# Patient Record
Sex: Female | Born: 1980 | Race: White | Hispanic: No | Marital: Single | Smoking: Current every day smoker
Health system: Southern US, Community
[De-identification: ages and names within clinical notes are randomized; demographics above are authoritative.]

## PROBLEM LIST (undated history)

## (undated) ENCOUNTER — Inpatient Hospital Stay (HOSPITAL_COMMUNITY): Payer: Self-pay

## (undated) DIAGNOSIS — G43909 Migraine, unspecified, not intractable, without status migrainosus: Secondary | ICD-10-CM

## (undated) DIAGNOSIS — M51369 Other intervertebral disc degeneration, lumbar region without mention of lumbar back pain or lower extremity pain: Secondary | ICD-10-CM

## (undated) DIAGNOSIS — F329 Major depressive disorder, single episode, unspecified: Secondary | ICD-10-CM

## (undated) DIAGNOSIS — F419 Anxiety disorder, unspecified: Secondary | ICD-10-CM

## (undated) DIAGNOSIS — M5136 Other intervertebral disc degeneration, lumbar region: Secondary | ICD-10-CM

## (undated) DIAGNOSIS — F32A Depression, unspecified: Secondary | ICD-10-CM

## (undated) HISTORY — PX: NO PAST SURGERIES: SHX2092

## (undated) HISTORY — DX: Other intervertebral disc degeneration, lumbar region: M51.36

## (undated) HISTORY — PX: INDUCED ABORTION: SHX677

---
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
## 1999-04-23 ENCOUNTER — Emergency Department (HOSPITAL_COMMUNITY): Admission: EM | Admit: 1999-04-23 | Discharge: 1999-04-23 | Payer: Self-pay | Admitting: Emergency Medicine

## 1999-09-12 ENCOUNTER — Other Ambulatory Visit: Admission: RE | Admit: 1999-09-12 | Discharge: 1999-09-12 | Payer: Self-pay | Admitting: Obstetrics

## 2000-02-21 ENCOUNTER — Inpatient Hospital Stay (HOSPITAL_COMMUNITY): Admission: AD | Admit: 2000-02-21 | Discharge: 2000-02-21 | Payer: Self-pay | Admitting: Obstetrics

## 2000-03-21 ENCOUNTER — Inpatient Hospital Stay: Admission: AD | Admit: 2000-03-21 | Discharge: 2000-03-21 | Payer: Self-pay | Admitting: Obstetrics

## 2000-04-01 ENCOUNTER — Inpatient Hospital Stay (HOSPITAL_COMMUNITY): Admission: AD | Admit: 2000-04-01 | Discharge: 2000-04-01 | Payer: Self-pay | Admitting: Obstetrics

## 2000-04-23 ENCOUNTER — Inpatient Hospital Stay (HOSPITAL_COMMUNITY): Admission: AD | Admit: 2000-04-23 | Discharge: 2000-04-23 | Payer: Self-pay | Admitting: Obstetrics

## 2000-04-30 ENCOUNTER — Inpatient Hospital Stay (HOSPITAL_COMMUNITY): Admission: AD | Admit: 2000-04-30 | Discharge: 2000-05-02 | Payer: Self-pay | Admitting: Obstetrics

## 2001-08-05 ENCOUNTER — Inpatient Hospital Stay (HOSPITAL_COMMUNITY): Admission: AD | Admit: 2001-08-05 | Discharge: 2001-08-05 | Payer: Self-pay | Admitting: Obstetrics

## 2001-10-11 ENCOUNTER — Inpatient Hospital Stay (HOSPITAL_COMMUNITY): Admission: AD | Admit: 2001-10-11 | Discharge: 2001-10-11 | Payer: Self-pay | Admitting: Obstetrics

## 2001-10-27 ENCOUNTER — Inpatient Hospital Stay (HOSPITAL_COMMUNITY): Admission: AD | Admit: 2001-10-27 | Discharge: 2001-10-27 | Payer: Self-pay | Admitting: Obstetrics

## 2001-10-31 ENCOUNTER — Inpatient Hospital Stay (HOSPITAL_COMMUNITY): Admission: AD | Admit: 2001-10-31 | Discharge: 2001-10-31 | Payer: Self-pay | Admitting: Obstetrics

## 2001-11-04 ENCOUNTER — Inpatient Hospital Stay (HOSPITAL_COMMUNITY): Admission: AD | Admit: 2001-11-04 | Discharge: 2001-11-06 | Payer: Self-pay | Admitting: Obstetrics

## 2002-11-25 ENCOUNTER — Inpatient Hospital Stay (HOSPITAL_COMMUNITY): Admission: AD | Admit: 2002-11-25 | Discharge: 2002-11-25 | Payer: Self-pay | Admitting: Obstetrics

## 2003-05-12 ENCOUNTER — Inpatient Hospital Stay (HOSPITAL_COMMUNITY): Admission: AD | Admit: 2003-05-12 | Discharge: 2003-05-12 | Payer: Self-pay | Admitting: Obstetrics

## 2003-05-25 ENCOUNTER — Inpatient Hospital Stay (HOSPITAL_COMMUNITY): Admission: AD | Admit: 2003-05-25 | Discharge: 2003-05-25 | Payer: Self-pay | Admitting: Obstetrics

## 2003-07-07 ENCOUNTER — Inpatient Hospital Stay (HOSPITAL_COMMUNITY): Admission: AD | Admit: 2003-07-07 | Discharge: 2003-07-07 | Payer: Self-pay | Admitting: Obstetrics

## 2003-07-13 ENCOUNTER — Inpatient Hospital Stay (HOSPITAL_COMMUNITY): Admission: AD | Admit: 2003-07-13 | Discharge: 2003-07-13 | Payer: Self-pay | Admitting: Obstetrics

## 2003-07-23 ENCOUNTER — Inpatient Hospital Stay (HOSPITAL_COMMUNITY): Admission: AD | Admit: 2003-07-23 | Discharge: 2003-07-23 | Payer: Self-pay | Admitting: Obstetrics

## 2003-08-02 ENCOUNTER — Inpatient Hospital Stay (HOSPITAL_COMMUNITY): Admission: AD | Admit: 2003-08-02 | Discharge: 2003-08-02 | Payer: Self-pay | Admitting: Obstetrics

## 2003-08-03 ENCOUNTER — Inpatient Hospital Stay (HOSPITAL_COMMUNITY): Admission: AD | Admit: 2003-08-03 | Discharge: 2003-08-05 | Payer: Self-pay | Admitting: Obstetrics

## 2004-01-31 ENCOUNTER — Emergency Department (HOSPITAL_COMMUNITY): Admission: EM | Admit: 2004-01-31 | Discharge: 2004-01-31 | Payer: Self-pay | Admitting: Emergency Medicine

## 2004-10-11 ENCOUNTER — Emergency Department (HOSPITAL_COMMUNITY): Admission: EM | Admit: 2004-10-11 | Discharge: 2004-10-11 | Payer: Self-pay | Admitting: Emergency Medicine

## 2005-08-02 ENCOUNTER — Emergency Department (HOSPITAL_COMMUNITY): Admission: EM | Admit: 2005-08-02 | Discharge: 2005-08-02 | Payer: Self-pay | Admitting: Emergency Medicine

## 2005-10-24 ENCOUNTER — Encounter: Payer: Self-pay | Admitting: Emergency Medicine

## 2005-10-25 ENCOUNTER — Emergency Department (HOSPITAL_COMMUNITY): Admission: EM | Admit: 2005-10-25 | Discharge: 2005-10-25 | Payer: Self-pay | Admitting: Family Medicine

## 2006-01-30 ENCOUNTER — Emergency Department (HOSPITAL_COMMUNITY): Admission: EM | Admit: 2006-01-30 | Discharge: 2006-01-30 | Payer: Self-pay | Admitting: Family Medicine

## 2006-03-26 ENCOUNTER — Emergency Department (HOSPITAL_COMMUNITY): Admission: EM | Admit: 2006-03-26 | Discharge: 2006-03-26 | Payer: Self-pay | Admitting: Family Medicine

## 2006-10-29 ENCOUNTER — Emergency Department (HOSPITAL_COMMUNITY): Admission: EM | Admit: 2006-10-29 | Discharge: 2006-10-29 | Payer: Self-pay | Admitting: Family Medicine

## 2007-04-23 ENCOUNTER — Inpatient Hospital Stay (HOSPITAL_COMMUNITY): Admission: AD | Admit: 2007-04-23 | Discharge: 2007-04-23 | Payer: Self-pay | Admitting: Obstetrics & Gynecology

## 2007-04-25 ENCOUNTER — Inpatient Hospital Stay (HOSPITAL_COMMUNITY): Admission: AD | Admit: 2007-04-25 | Discharge: 2007-04-25 | Payer: Self-pay | Admitting: Obstetrics

## 2007-05-05 ENCOUNTER — Inpatient Hospital Stay (HOSPITAL_COMMUNITY): Admission: AD | Admit: 2007-05-05 | Discharge: 2007-05-05 | Payer: Self-pay | Admitting: Obstetrics

## 2007-07-02 ENCOUNTER — Ambulatory Visit (HOSPITAL_COMMUNITY): Admission: RE | Admit: 2007-07-02 | Discharge: 2007-07-02 | Payer: Self-pay | Admitting: Obstetrics

## 2007-08-08 ENCOUNTER — Emergency Department (HOSPITAL_COMMUNITY): Admission: EM | Admit: 2007-08-08 | Discharge: 2007-08-08 | Payer: Self-pay | Admitting: Emergency Medicine

## 2007-08-09 ENCOUNTER — Inpatient Hospital Stay (HOSPITAL_COMMUNITY): Admission: AD | Admit: 2007-08-09 | Discharge: 2007-08-09 | Payer: Self-pay | Admitting: Obstetrics

## 2007-10-31 ENCOUNTER — Inpatient Hospital Stay (HOSPITAL_COMMUNITY): Admission: AD | Admit: 2007-10-31 | Discharge: 2007-10-31 | Payer: Self-pay | Admitting: Obstetrics

## 2007-11-24 ENCOUNTER — Inpatient Hospital Stay (HOSPITAL_COMMUNITY): Admission: AD | Admit: 2007-11-24 | Discharge: 2007-11-24 | Payer: Self-pay | Admitting: Obstetrics

## 2007-12-28 ENCOUNTER — Inpatient Hospital Stay (HOSPITAL_COMMUNITY): Admission: AD | Admit: 2007-12-28 | Discharge: 2007-12-28 | Payer: Self-pay | Admitting: Obstetrics

## 2008-01-14 ENCOUNTER — Inpatient Hospital Stay (HOSPITAL_COMMUNITY): Admission: AD | Admit: 2008-01-14 | Discharge: 2008-01-14 | Payer: Self-pay | Admitting: Obstetrics

## 2008-01-22 ENCOUNTER — Inpatient Hospital Stay (HOSPITAL_COMMUNITY): Admission: AD | Admit: 2008-01-22 | Discharge: 2008-01-22 | Payer: Self-pay | Admitting: Obstetrics

## 2008-01-26 ENCOUNTER — Inpatient Hospital Stay (HOSPITAL_COMMUNITY): Admission: AD | Admit: 2008-01-26 | Discharge: 2008-01-28 | Payer: Self-pay | Admitting: Obstetrics

## 2008-03-09 ENCOUNTER — Emergency Department (HOSPITAL_COMMUNITY): Admission: EM | Admit: 2008-03-09 | Discharge: 2008-03-09 | Payer: Self-pay | Admitting: Family Medicine

## 2008-08-25 ENCOUNTER — Inpatient Hospital Stay (HOSPITAL_COMMUNITY): Admission: AD | Admit: 2008-08-25 | Discharge: 2008-08-25 | Payer: Self-pay | Admitting: Obstetrics

## 2008-09-18 IMAGING — US US OB COMP LESS 14 WK
1 series · 14 of 27 positions shown · non-contrast
Comparison: none

OBSTETRICAL ULTRASOUND:

 This ultrasound exam was performed in the [HOSPITAL] Ultrasound Department.  The OB US report was generated in the AS system, and faxed to the ordering physician.  This report is also available in [REDACTED] PACS.

[Series 1: us ob comp less 14 wk · 0.26mm/px · 14 of 27 slices shown]
[im 1/27]
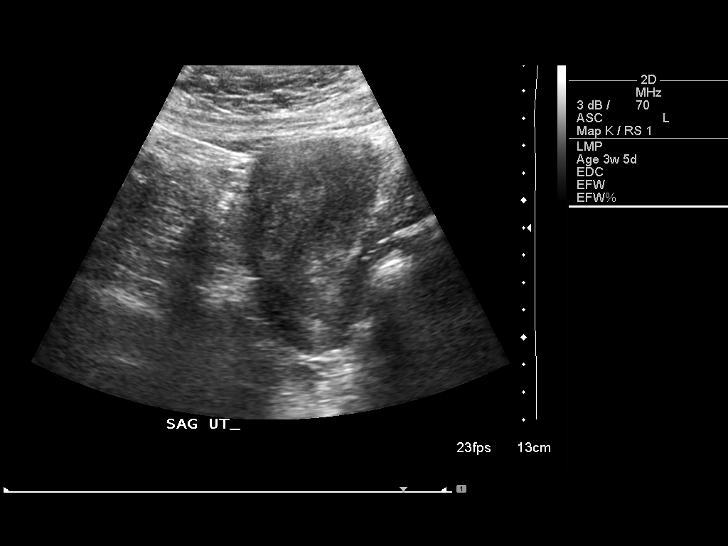
[im 3/27]
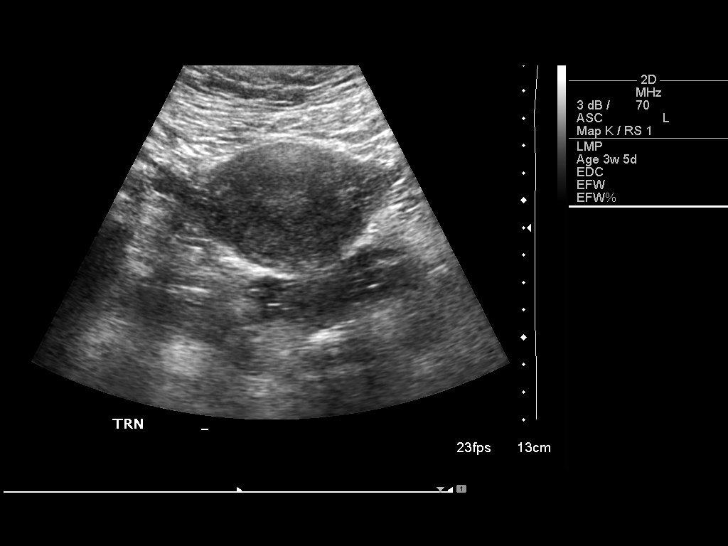
[im 5/27]
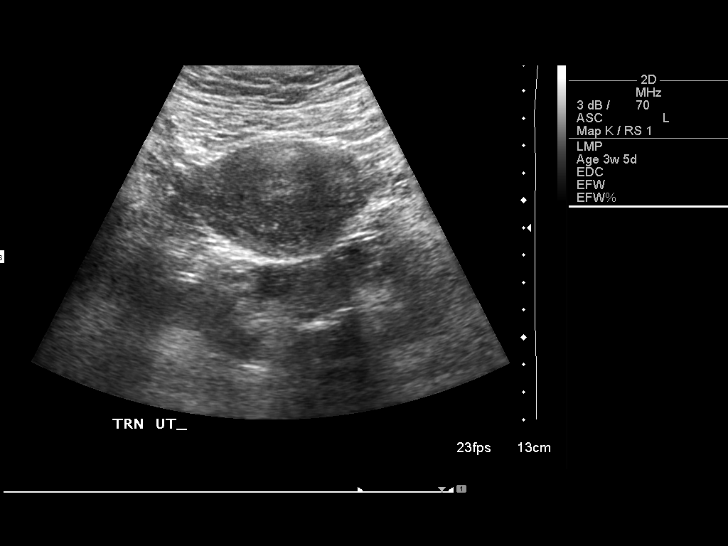
[im 7/27]
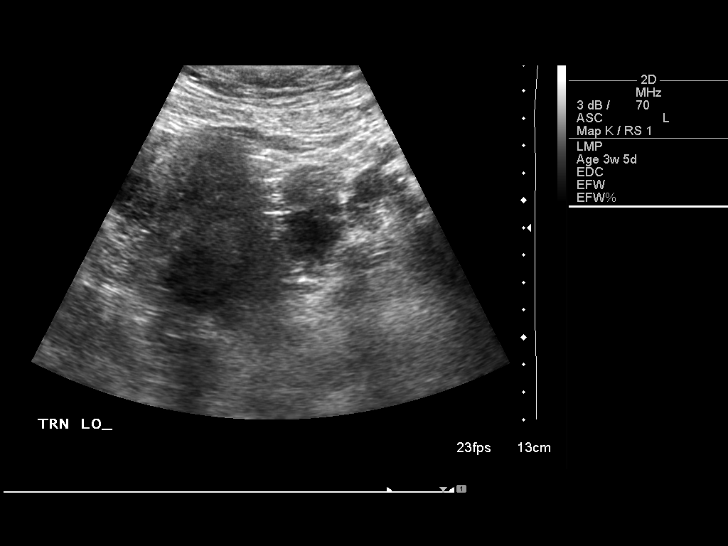
[im 9/27]
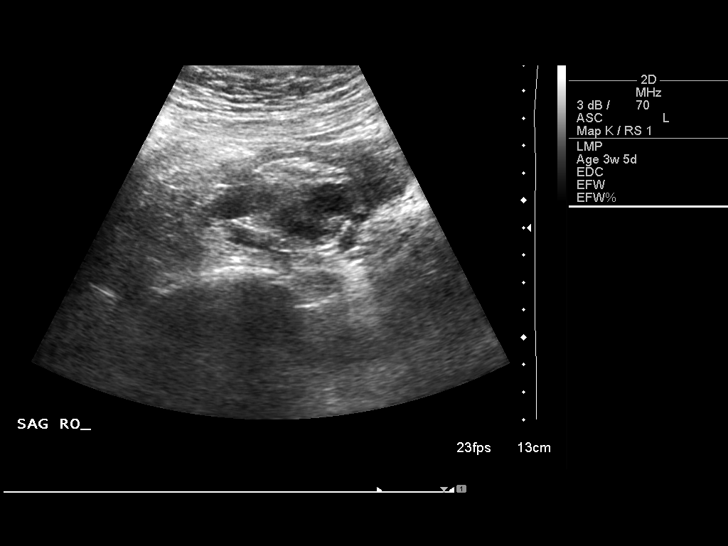
[im 11/27]
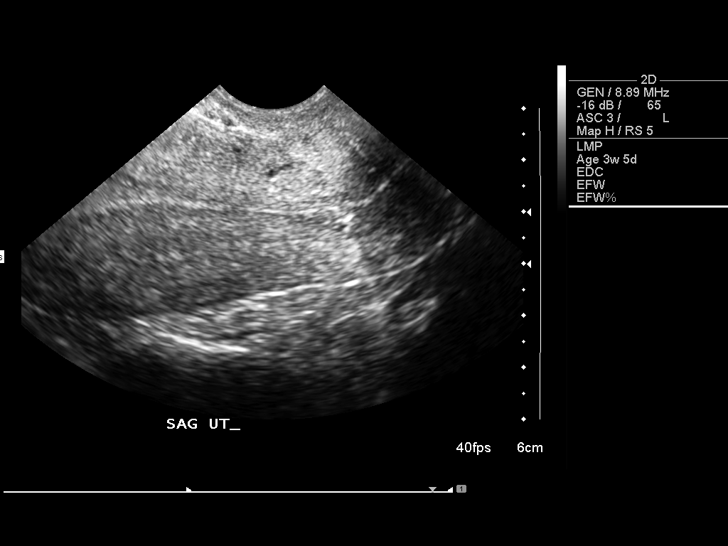
[im 13/27]
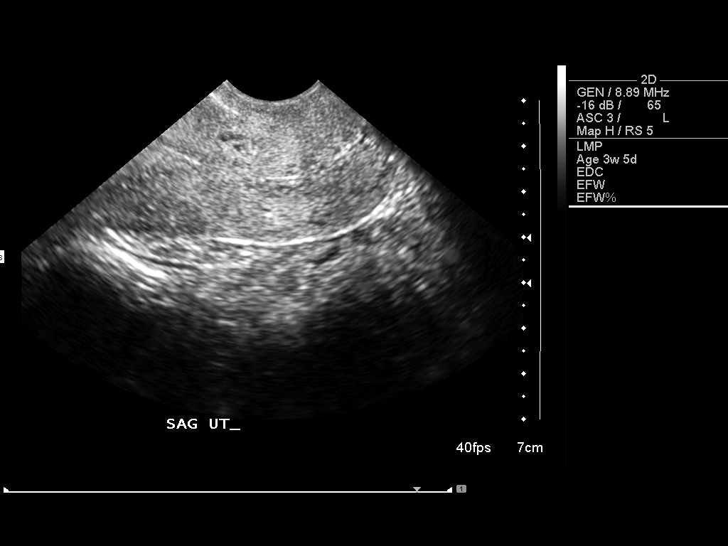
[im 15/27]
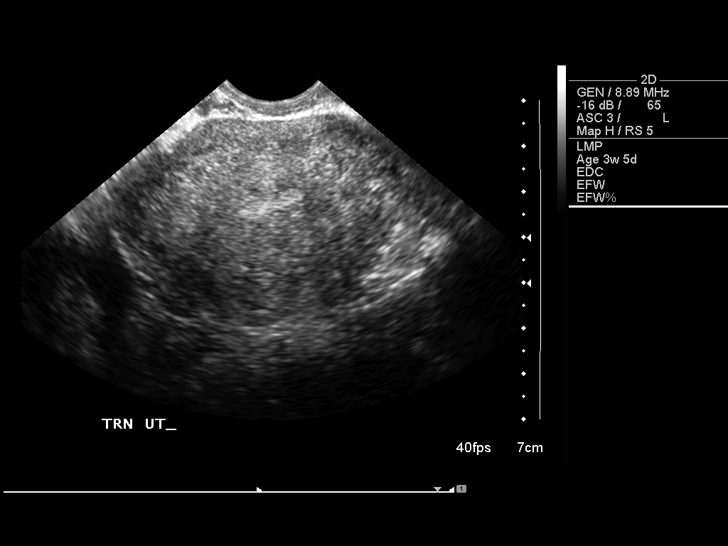
[im 17/27]
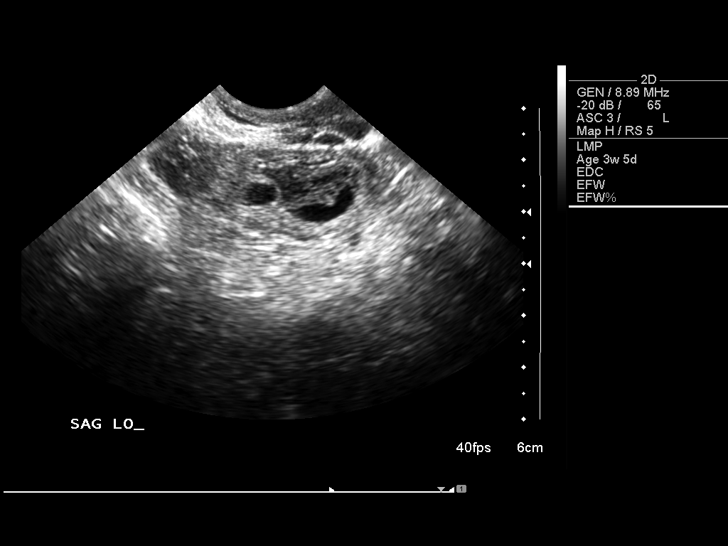
[im 19/27]
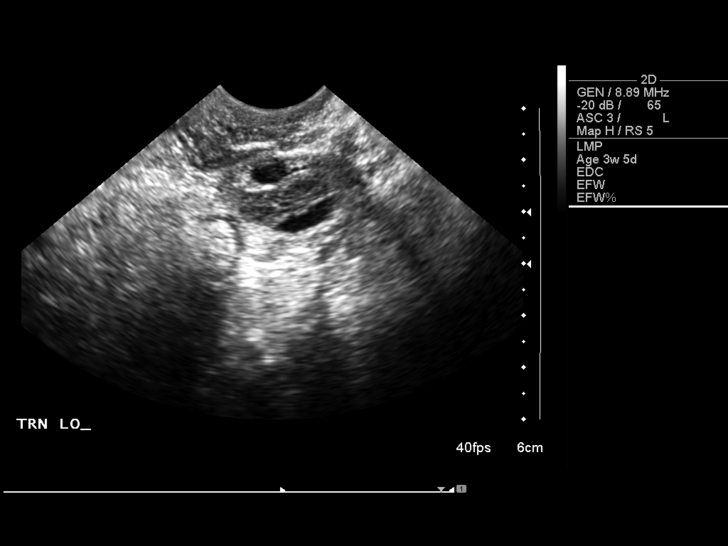
[im 21/27]
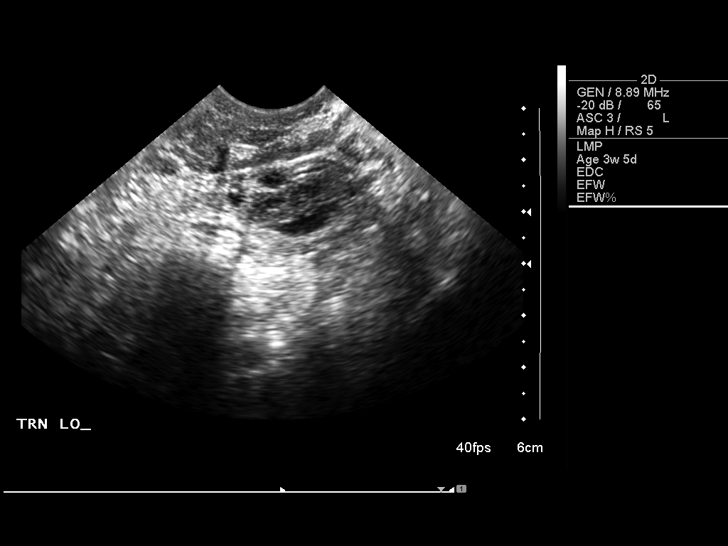
[im 23/27]
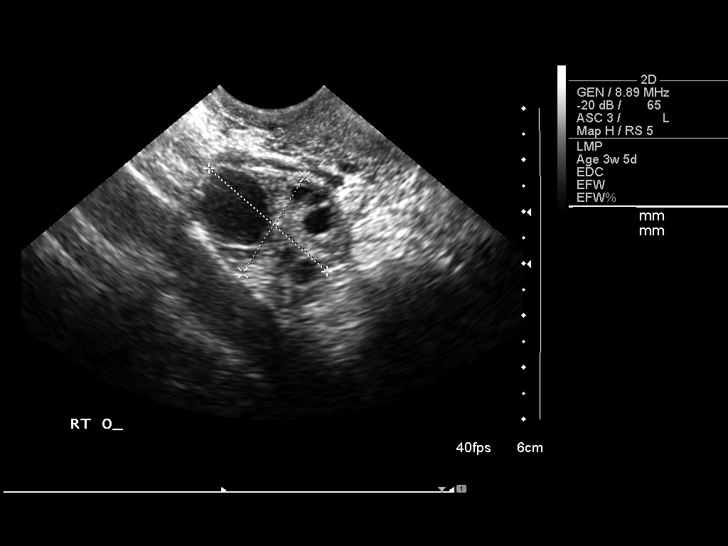
[im 25/27]
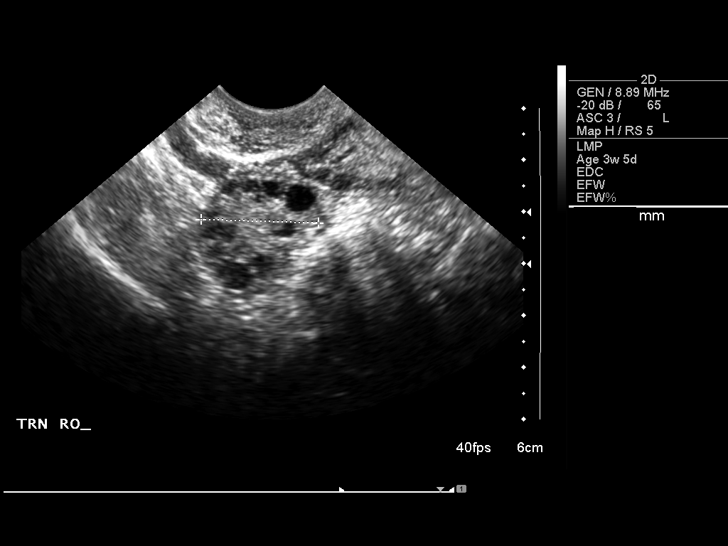
[im 27/27]
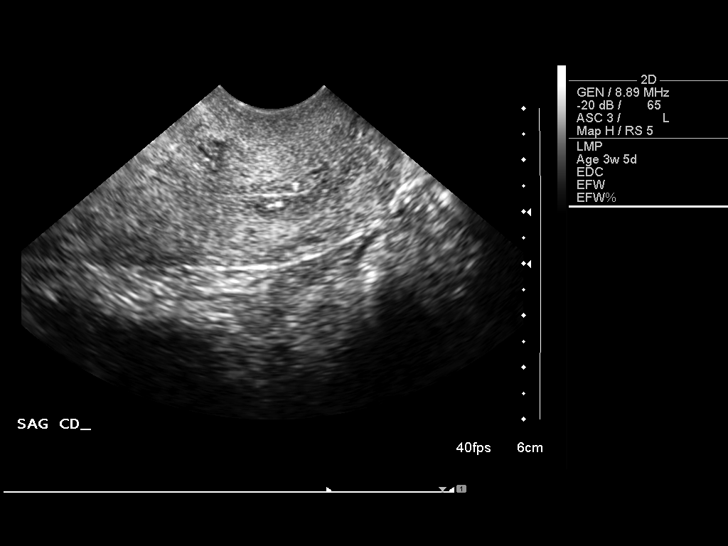

[14 of 27 positions shown; findings below may reference images not displayed]

IMPRESSION: See AS Obstetric US report.

## 2008-09-19 ENCOUNTER — Emergency Department (HOSPITAL_COMMUNITY): Admission: EM | Admit: 2008-09-19 | Discharge: 2008-09-19 | Payer: Self-pay | Admitting: Family Medicine

## 2009-01-24 ENCOUNTER — Emergency Department (HOSPITAL_COMMUNITY): Admission: EM | Admit: 2009-01-24 | Discharge: 2009-01-24 | Payer: Self-pay | Admitting: Emergency Medicine

## 2010-10-05 LAB — CBC
Hemoglobin: 13.3 g/dL (ref 12.0–15.0)
MCHC: 34.2 g/dL (ref 30.0–36.0)
RBC: 4.39 MIL/uL (ref 3.87–5.11)
WBC: 5.1 10*3/uL (ref 4.0–10.5)

## 2010-10-05 LAB — GC/CHLAMYDIA PROBE AMP, GENITAL
Chlamydia, DNA Probe: NEGATIVE
GC Probe Amp, Genital: NEGATIVE

## 2010-10-05 LAB — WET PREP, GENITAL
Clue Cells Wet Prep HPF POC: NONE SEEN
Trich, Wet Prep: NONE SEEN

## 2010-10-30 ENCOUNTER — Inpatient Hospital Stay (INDEPENDENT_AMBULATORY_CARE_PROVIDER_SITE_OTHER)
Admission: RE | Admit: 2010-10-30 | Discharge: 2010-10-30 | Disposition: A | Discharge status: Home / Self Care | Payer: Medicaid Other | Source: Ambulatory Visit | Attending: Family Medicine | Admitting: Family Medicine

## 2010-10-30 DIAGNOSIS — M26609 Unspecified temporomandibular joint disorder, unspecified side: Secondary | ICD-10-CM

## 2011-02-02 ENCOUNTER — Ambulatory Visit (INDEPENDENT_AMBULATORY_CARE_PROVIDER_SITE_OTHER): Payer: Medicaid Other

## 2011-02-02 ENCOUNTER — Inpatient Hospital Stay (INDEPENDENT_AMBULATORY_CARE_PROVIDER_SITE_OTHER)
Admission: RE | Admit: 2011-02-02 | Discharge: 2011-02-02 | Disposition: A | Discharge status: Home / Self Care | Payer: Medicaid Other | Source: Ambulatory Visit | Attending: Family Medicine | Admitting: Family Medicine

## 2011-02-02 DIAGNOSIS — K5900 Constipation, unspecified: Secondary | ICD-10-CM

## 2011-03-16 LAB — URINALYSIS, ROUTINE W REFLEX MICROSCOPIC
Glucose, UA: 100 — AB
Hgb urine dipstick: NEGATIVE
Ketones, ur: >80 — AB
Protein, ur: 30 — AB
Urobilinogen, UA: 1

## 2011-03-16 LAB — URINE MICROSCOPIC-ADD ON

## 2011-03-16 LAB — WET PREP, GENITAL
Clue Cells Wet Prep HPF POC: NONE SEEN
Trich, Wet Prep: NONE SEEN

## 2011-03-21 LAB — URINALYSIS, ROUTINE W REFLEX MICROSCOPIC
Bilirubin Urine: NEGATIVE
Hgb urine dipstick: NEGATIVE
Ketones, ur: NEGATIVE
Nitrite: NEGATIVE
Protein, ur: NEGATIVE
Specific Gravity, Urine: 1.015
Urobilinogen, UA: 0.2

## 2011-03-22 LAB — RH IMMUNE GLOBULIN WORKUP (NOT WOMEN'S HOSP): Antibody Screen: NEGATIVE

## 2011-03-23 LAB — RH IMMUNE GLOB WKUP(>/=20WKS)(NOT WOMEN'S HOSP)

## 2011-03-23 LAB — CBC
HCT: 30.3 — ABNORMAL LOW
Hemoglobin: 11.4 — ABNORMAL LOW
Platelets: 149 — ABNORMAL LOW
Platelets: 159
RBC: 3.48 — ABNORMAL LOW
RDW: 13.3
WBC: 6.2
WBC: 8

## 2011-03-23 LAB — CCBB MATERNAL DONOR DRAW

## 2011-04-04 LAB — ABO/RH: ABO/RH(D): O NEG

## 2011-04-04 LAB — WET PREP, GENITAL
Trich, Wet Prep: NONE SEEN
Yeast Wet Prep HPF POC: NONE SEEN

## 2011-04-04 LAB — URINE MICROSCOPIC-ADD ON

## 2011-04-04 LAB — URINALYSIS, ROUTINE W REFLEX MICROSCOPIC
Glucose, UA: NEGATIVE
Ketones, ur: >80 — AB
pH: 6.5

## 2011-04-04 LAB — POCT PREGNANCY, URINE
Operator id: 120561
Preg Test, Ur: POSITIVE

## 2011-04-04 LAB — RH IMMUNE GLOBULIN WORKUP (NOT WOMEN'S HOSP)
ABO/RH(D): O NEG
Antibody Screen: NEGATIVE

## 2011-04-04 LAB — HCG, QUANTITATIVE, PREGNANCY: hCG, Beta Chain, Quant, S: 139 — ABNORMAL HIGH

## 2011-04-04 LAB — GC/CHLAMYDIA PROBE AMP, GENITAL
Chlamydia, DNA Probe: NEGATIVE
GC Probe Amp, Genital: NEGATIVE

## 2011-04-04 LAB — CBC
RBC: 4.25
WBC: 5.8

## 2011-06-27 ENCOUNTER — Encounter: Payer: Self-pay | Admitting: *Deleted

## 2011-06-27 ENCOUNTER — Emergency Department (INDEPENDENT_AMBULATORY_CARE_PROVIDER_SITE_OTHER)
Admission: EM | Admit: 2011-06-27 | Discharge: 2011-06-27 | Disposition: A | Discharge status: Home / Self Care | Payer: Self-pay | Source: Home / Self Care | Attending: Emergency Medicine | Admitting: Emergency Medicine

## 2011-06-27 DIAGNOSIS — R05 Cough: Secondary | ICD-10-CM

## 2011-06-27 DIAGNOSIS — J329 Chronic sinusitis, unspecified: Secondary | ICD-10-CM

## 2011-06-27 MED ORDER — GUAIFENESIN-CODEINE 100-10 MG/5ML PO SYRP: 5.0000 mL | ORAL_SOLUTION | Freq: Three times a day (TID) | ORAL | Status: AC | PRN

## 2011-06-27 MED ORDER — AZITHROMYCIN 250 MG PO TABS: 250.0000 mg | ORAL_TABLET | Freq: Every day | ORAL | Status: AC

## 2011-06-27 MED ORDER — PREDNISONE 10 MG PO TABS: 20.0000 mg | ORAL_TABLET | Freq: Every day | ORAL | Status: AC

## 2011-06-27 NOTE — ED Notes (Signed)
Hannah Rodriguez  Has  Symptoms  Of  Cough  Congestion  Nasal  stuffyness  With     Yellow  Greenish  Secretions     Symptoms  Persisting  For  Weeks    Also  Is  Late  On period

## 2011-06-27 NOTE — ED Provider Notes (Signed)
History     CSN: [id number]  Arrival date & time 06/27/11  1113   First MD Initiated Contact with Patient 06/27/11 1336      Chief Complaint  Patient presents with  . Cough    (Consider location/radiation/quality/duration/timing/severity/associated sxs/prior treatment) HPI Comments: Sinus, congestion and presurre with green phlegm and coughing not going away, NO SOB, ITS BEEN 3 WEEKS AND IS NOT GETTING ANY BETTER  Patient is a 31 y.o. female presenting with sinusitis.  Sinusitis  This is a recurrent problem. The problem has not changed since onset.There has been no fever. The pain is at a severity of 6/10. The patient is experiencing no pain. The pain has been constant since onset. Associated symptoms include congestion, ear pain, sinus pressure and cough. Pertinent negatives include no sweats and no shortness of breath. She has tried nothing for the symptoms. The treatment provided no relief.    Past Medical History  Diagnosis Date  . Asthma     History reviewed. No pertinent past surgical history.  No family history on file.  History  Substance Use Topics  . Smoking status: Never Smoker   . Smokeless tobacco: Not on file  . Alcohol Use: Yes    OB History    Grav Para Term Preterm Abortions TAB SAB Ect Mult Living                  Review of Systems  Constitutional: Positive for appetite change. Negative for fever and fatigue.  HENT: Positive for ear pain, congestion and sinus pressure. Negative for neck stiffness.   Respiratory: Positive for cough. Negative for shortness of breath.   Gastrointestinal: Negative for nausea, abdominal pain and constipation.  Skin: Negative for rash.    Allergies  Review of patient's allergies indicates no known allergies.  Home Medications   Current Outpatient Rx  Name Route Sig Dispense Refill  . ALBUTEROL SULFATE HFA 108 (90 BASE) MCG/ACT IN AERS Inhalation Inhale 2 puffs into the lungs every 6 (six) hours as needed.      Marland Kitchen  PHENTERMINE HCL 37.5 MG PO CAPS Oral Take 37.5 mg by mouth every morning.      . AZITHROMYCIN 250 MG PO TABS Oral Take 1 tablet (250 mg total) by mouth daily. Take first 2 tablets together, then 1 every day until finished. 6 tablet 0  . GUAIFENESIN-CODEINE 100-10 MG/5ML PO SYRP Oral Take 5 mLs by mouth 3 (three) times daily as needed for cough. 120 mL 0  . PREDNISONE 10 MG PO TABS Oral Take 2 tablets (20 mg total) by mouth daily. 10 tablet 0    BP 118/66  Pulse 86  Temp(Src) 98.2 F (36.8 C) (Oral)  Resp 20  SpO2 100%  Physical Exam  Nursing note and vitals reviewed. Constitutional: She appears well-developed and well-nourished. No distress.  HENT:  Head: Normocephalic.  Mouth/Throat: No oropharyngeal exudate.  Eyes: Conjunctivae are normal. Right eye exhibits no discharge. Left eye exhibits no discharge.  Neck: Normal range of motion. Neck supple. No JVD present.  Cardiovascular: Normal rate.   Pulmonary/Chest: Effort normal and breath sounds normal. No respiratory distress. She has no decreased breath sounds. She has no wheezes. She has no rales. She exhibits no tenderness.  Abdominal: Soft.  Musculoskeletal: Normal range of motion.  Lymphadenopathy:    She has no cervical adenopathy.  Skin: Skin is warm. No rash noted.    ED Course  Procedures (including critical care time)   Labs Reviewed  POCT  PREGNANCY, URINE   No results found.   1. Sinusitis   2. Cough       MDM  Recurrent respiratory symptoms cough,sinus congestion.Afebrile, no dyspnea.        Jimmie Molly, MD 06/27/11 1730

## 2012-01-24 ENCOUNTER — Other Ambulatory Visit: Payer: Self-pay | Admitting: Obstetrics

## 2012-02-23 ENCOUNTER — Encounter (HOSPITAL_COMMUNITY): Payer: Self-pay | Admitting: Emergency Medicine

## 2012-02-23 ENCOUNTER — Emergency Department (HOSPITAL_COMMUNITY): Payer: No Typology Code available for payment source

## 2012-02-23 ENCOUNTER — Emergency Department (HOSPITAL_COMMUNITY)
Admission: EM | Admit: 2012-02-23 | Discharge: 2012-02-23 | Disposition: A | Discharge status: Home / Self Care | Payer: No Typology Code available for payment source | Attending: Emergency Medicine | Admitting: Emergency Medicine

## 2012-02-23 DIAGNOSIS — Y92410 Unspecified street and highway as the place of occurrence of the external cause: Secondary | ICD-10-CM | POA: Insufficient documentation

## 2012-02-23 DIAGNOSIS — M7918 Myalgia, other site: Secondary | ICD-10-CM

## 2012-02-23 DIAGNOSIS — IMO0001 Reserved for inherently not codable concepts without codable children: Secondary | ICD-10-CM | POA: Insufficient documentation

## 2012-02-23 DIAGNOSIS — Z885 Allergy status to narcotic agent status: Secondary | ICD-10-CM | POA: Insufficient documentation

## 2012-02-23 DIAGNOSIS — J45909 Unspecified asthma, uncomplicated: Secondary | ICD-10-CM | POA: Insufficient documentation

## 2012-02-23 MED ORDER — TRAMADOL HCL 50 MG PO TABS: 50.0000 mg | ORAL_TABLET | Freq: Four times a day (QID) | ORAL | Status: AC | PRN
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
## 2012-02-23 NOTE — ED Provider Notes (Signed)
Medical screening examination/treatment/procedure(s) were performed by non-physician practitioner and as supervising physician I was immediately available for consultation/collaboration.  Donnetta Hutching, MD 02/23/12 1504
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
## 2013-06-08 ENCOUNTER — Emergency Department (HOSPITAL_COMMUNITY)
Admission: EM | Admit: 2013-06-08 | Discharge: 2013-06-08 | Disposition: A | Discharge status: Home / Self Care | Payer: Medicaid Other | Source: Home / Self Care

## 2013-06-08 ENCOUNTER — Encounter (HOSPITAL_COMMUNITY): Payer: Self-pay | Admitting: Emergency Medicine

## 2013-06-08 ENCOUNTER — Emergency Department (INDEPENDENT_AMBULATORY_CARE_PROVIDER_SITE_OTHER): Payer: Medicaid Other

## 2013-06-08 DIAGNOSIS — S93409A Sprain of unspecified ligament of unspecified ankle, initial encounter: Secondary | ICD-10-CM

## 2013-06-08 DIAGNOSIS — S93402A Sprain of unspecified ligament of left ankle, initial encounter: Secondary | ICD-10-CM

## 2013-06-08 LAB — POCT URINALYSIS DIP (DEVICE)
Glucose, UA: NEGATIVE mg/dL
Specific Gravity, Urine: 1.025 (ref 1.005–1.030)
Urobilinogen, UA: 0.2 mg/dL (ref 0.0–1.0)

## 2013-06-08 NOTE — ED Notes (Signed)
Sprain when stepped pothole last week, pain in ankle /foot since then, minimal relief w motrin

## 2013-06-08 NOTE — ED Provider Notes (Signed)
Hannah Rodriguez is a 32 y.o. female who presents to Urgent Care today for an ankle injury. She reports that last Tuesday she fell after stepping in a pothole and left ankle turning in. At that time it was very swollen and she had trouble walking. Swelling has gone down significantly but she is still having pain and trouble walking on it, especially if she turns it inwards. This has never occurred before. She takes ibuprofen regularly for headaches and is unsure if this is helping her ankle.    Past Medical History  Diagnosis Date  . Asthma    History  Substance Use Topics  . Smoking status: Never Smoker   . Smokeless tobacco: Never Used  . Alcohol Use: Yes     Comment: socially beer   ROS as above Medications reviewed. No current facility-administered medications for this encounter.   Current Outpatient Prescriptions  Medication Sig Dispense Refill  . albuterol (PROVENTIL HFA;VENTOLIN HFA) 108 (90 BASE) MCG/ACT inhaler Inhale 2 puffs into the lungs every 6 (six) hours as needed. For shortness of breath.      Marland Kitchen ibuprofen (ADVIL,MOTRIN) 800 MG tablet Take 800 mg by mouth every 8 (eight) hours as needed. For pain.      . methocarbamol (ROBAXIN) 500 MG tablet Take 500 mg by mouth 4 (four) times daily as needed. For muscle pain.        Exam:  BP 119/82  Pulse 74  Temp(Src) 99 F (37.2 C) (Oral)  Resp 16  SpO2 99%  LMP 02/06/2013 Gen: Well NAD HEENT: EOMI,  MMM Lungs: Normal work of breathing. Exts: warm and well perfused.  Left ankle with no erythema, mild swelling bilateral ankle, tender anterior foot and exquisitely tender lateral malleolus, mild fifth metatarsal tenderness, no fibular head tenderness, gait antalgic  Results for orders placed during the hospital encounter of 06/08/13 (from the past 24 hour(s))  POCT URINALYSIS DIP (DEVICE)     Status: Abnormal   Collection Time    06/08/13  8:39 AM      Result Value Range   Glucose, UA NEGATIVE  NEGATIVE mg/dL   Bilirubin  Urine NEGATIVE  NEGATIVE   Ketones, ur NEGATIVE  NEGATIVE mg/dL   Specific Gravity, Urine 1.025  1.005 - 1.030   Hgb urine dipstick MODERATE (*) NEGATIVE   pH 6.0  5.0 - 8.0   Protein, ur NEGATIVE  NEGATIVE mg/dL   Urobilinogen, UA 0.2  0.0 - 1.0 mg/dL   Nitrite NEGATIVE  NEGATIVE   Leukocytes, UA NEGATIVE  NEGATIVE  POCT PREGNANCY, URINE     Status: None   Collection Time    06/08/13  8:41 AM      Result Value Range   Preg Test, Ur NEGATIVE  NEGATIVE   Dg Ankle Complete Left  06/08/2013   CLINICAL DATA:  Fall into pothole with left ankle twisting injury. Swelling.  EXAM: LEFT ANKLE COMPLETE - 3+ VIEW  COMPARISON:  None.  FINDINGS: Mild soft tissue swelling without acute osseous abnormality. Tiny plantar calcaneal spur. Tiny dense structure in the lateral plantar soft tissues appears too far removed to represent a fracture.  IMPRESSION: Soft tissue swelling without acute fracture.   Electronically Signed   By: Leanna Battles M.D.   On: 06/08/2013 09:32    Assessment and Plan: 32 y.o. female with likely left ankle sprain from injury 1 week ago, no fracture on xray, antalgic gait.  - ROM exercises provided. - Follow up with Sports Medicine in 5-7  days if not significantly improving. - Left ASO brace applied. - Continue current pain management with ibuprofen.  Leona Singleton, MD   Leona Singleton, MD 06/08/13 1019

## 2013-06-10 NOTE — ED Provider Notes (Signed)
Medical screening examination/treatment/procedure(s) were performed by a resident physician or non-physician practitioner and as the supervising physician I was immediately available for consultation/collaboration.  Clementeen Graham, MD    Rodolph Bong, MD 06/10/13 [phone]

## 2013-11-24 ENCOUNTER — Encounter (HOSPITAL_COMMUNITY): Payer: Self-pay | Admitting: Emergency Medicine

## 2013-11-24 ENCOUNTER — Emergency Department (INDEPENDENT_AMBULATORY_CARE_PROVIDER_SITE_OTHER)
Admission: EM | Admit: 2013-11-24 | Discharge: 2013-11-24 | Disposition: A | Discharge status: Home / Self Care | Payer: Medicaid Other | Source: Home / Self Care | Attending: Family Medicine | Admitting: Family Medicine

## 2013-11-24 DIAGNOSIS — S060X9A Concussion with loss of consciousness of unspecified duration, initial encounter: Secondary | ICD-10-CM

## 2013-11-24 DIAGNOSIS — N938 Other specified abnormal uterine and vaginal bleeding: Secondary | ICD-10-CM

## 2013-11-24 DIAGNOSIS — S060XAA Concussion with loss of consciousness status unknown, initial encounter: Secondary | ICD-10-CM

## 2013-11-24 DIAGNOSIS — S161XXA Strain of muscle, fascia and tendon at neck level, initial encounter: Secondary | ICD-10-CM

## 2013-11-24 DIAGNOSIS — S139XXA Sprain of joints and ligaments of unspecified parts of neck, initial encounter: Secondary | ICD-10-CM

## 2013-11-24 DIAGNOSIS — S060X0A Concussion without loss of consciousness, initial encounter: Secondary | ICD-10-CM

## 2013-11-24 DIAGNOSIS — N949 Unspecified condition associated with female genital organs and menstrual cycle: Secondary | ICD-10-CM

## 2013-11-24 DIAGNOSIS — S301XXA Contusion of abdominal wall, initial encounter: Secondary | ICD-10-CM

## 2013-11-24 LAB — POCT URINALYSIS DIP (DEVICE)
BILIRUBIN URINE: NEGATIVE
Glucose, UA: NEGATIVE mg/dL
Hgb urine dipstick: NEGATIVE
KETONES UR: NEGATIVE mg/dL
LEUKOCYTES UA: NEGATIVE
NITRITE: NEGATIVE
PH: 7 (ref 5.0–8.0)
PROTEIN: NEGATIVE mg/dL
Specific Gravity, Urine: >=1.03 (ref 1.005–1.030)
Urobilinogen, UA: 1 mg/dL (ref 0.0–1.0)

## 2013-11-24 LAB — POCT PREGNANCY, URINE: PREG TEST UR: NEGATIVE
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
## 2013-11-24 NOTE — Discharge Instructions (Signed)
Your urine studies and pregnancy tests were normal and negative. I suspect you have a very mild concussion and that your symptoms should improve and resolve over the next 1-2 weeks. Use tylenol as indicated on the packaging for pain. Keep a close eye on your vaginal spotting. If bleeding becomes severe or persistent or associated with abdominal or pelvic pain, please have yourself re-evaluated at your nearest emergency room.   Abnormal Uterine Bleeding Abnormal uterine bleeding can affect women at various stages in life, including teenagers, women in their reproductive years, pregnant women, and women who have reached menopause. Several kinds of uterine bleeding are considered abnormal, including:  Bleeding or spotting between periods.   Bleeding after sexual intercourse.   Bleeding that is heavier or more than normal.   Periods that last longer than usual.  Bleeding after menopause.  Many cases of abnormal uterine bleeding are minor and simple to treat, while others are more serious. Any type of abnormal bleeding should be evaluated by your health care provider. Treatment will depend on the cause of the bleeding. HOME CARE INSTRUCTIONS Monitor your condition for any changes. The following actions may help to alleviate any discomfort you are experiencing:  Avoid the use of tampons and douches as directed by your health care provider.  Change your pads frequently. You should get regular pelvic exams and Pap tests. Keep all follow-up appointments for diagnostic tests as directed by your health care provider.  SEEK MEDICAL CARE IF:   Your bleeding lasts more than 1 week.   You feel dizzy at times.  SEEK IMMEDIATE MEDICAL CARE IF:   You pass out.   You are changing pads every 15 to 30 minutes.   You have abdominal pain.  You have a fever.   You become sweaty or weak.   You are passing large blood clots from the vagina.   You start to feel nauseous and vomit. MAKE SURE  YOU:   Understand these instructions.  Will watch your condition.  Will get help right away if you are not doing well or get worse. Document Released: 06/11/2005 Document Revised: 02/11/2013 Document Reviewed: 01/08/2013 [facility] Patient Information 2014 [place], [place].  Blunt Abdominal Trauma A blunt injury to the abdomen can cause pain. The pain is most likely from bruising and stretching of your muscles. This pain is often made worse with movement. Most often these injuries are not serious and get better within 1 week with rest and mild pain medicine. However, internal organs (liver, spleen, kidneys) can be injured with blunt trauma. If you do not get better or if you get worse, further examination may be needed. Continue with your regular daily activities, but avoid any strenuous activities until your pain is improved. If your stomach is upset, stick to a clear liquid diet and slowly advance to solid food.  SEEK IMMEDIATE MEDICAL CARE IF:   You develop increasing pain, nausea, or repeated vomiting.  You develop chest pain or breathing difficulty.  You develop blood in the urine, vomit, or stool.  You develop weakness, fainting, fever, or other serious complaints. Document Released: 07/19/2004 Document Revised: 09/03/2011 Document Reviewed: 11/04/2008 [facility] Patient Information 2014 [place], [place].  Cervical Sprain A cervical sprain is an injury in the neck in which the strong, fibrous tissues (ligaments) that connect your neck bones stretch or tear. Cervical sprains can range from mild to severe. Severe cervical sprains can cause the neck vertebrae to be unstable. This can lead to damage of the spinal cord  and can result in serious nervous system problems. The amount of time it takes for a cervical sprain to get better depends on the cause and extent of the injury. Most cervical sprains heal in 1 to 3 weeks. CAUSES  Severe cervical sprains may be caused by:   Contact sport  injuries (such as from football, rugby, wrestling, hockey, auto racing, gymnastics, diving, martial arts, or boxing).   Motor vehicle collisions.   Whiplash injuries. This is an injury from a sudden forward-and backward whipping movement of the head and neck.  Falls.  Mild cervical sprains may be caused by:   Being in an awkward position, such as while cradling a telephone between your ear and shoulder.   Sitting in a chair that does not offer proper support.   Working at a poorly Marketing executive station.   Looking up or down for long periods of time.  SYMPTOMS   Pain, soreness, stiffness, or a burning sensation in the front, back, or sides of the neck. This discomfort may develop immediately after the injury or slowly, 24 hours or more after the injury.   Pain or tenderness directly in the middle of the back of the neck.   Shoulder or upper back pain.   Limited ability to move the neck.   Headache.   Dizziness.   Weakness, numbness, or tingling in the hands or arms.   Muscle spasms.   Difficulty swallowing or chewing.   Tenderness and swelling of the neck.  DIAGNOSIS  Most of the time your health care provider can diagnose a cervical sprain by taking your history and doing a physical exam. Your health care provider will ask about previous neck injuries and any known neck problems, such as arthritis in the neck. X-rays may be taken to find out if there are any other problems, such as with the bones of the neck. Other tests, such as a CT scan or MRI, may also be needed.  TREATMENT  Treatment depends on the severity of the cervical sprain. Mild sprains can be treated with rest, keeping the neck in place (immobilization), and pain medicines. Severe cervical sprains are immediately immobilized. Further treatment is done to help with pain, muscle spasms, and other symptoms and may include:  Medicines, such as pain relievers, numbing medicines, or muscle  relaxants.   Physical therapy. This may involve stretching exercises, strengthening exercises, and posture training. Exercises and improved posture can help stabilize the neck, strengthen muscles, and help stop symptoms from returning.  HOME CARE INSTRUCTIONS   Put ice on the injured area.   Put ice in a plastic bag.   Place a towel between your skin and the bag.   Leave the ice on for 15 20 minutes, 3 4 times a day.   If your injury was severe, you may have been given a cervical collar to wear. A cervical collar is a two-piece collar designed to keep your neck from moving while it heals.  Do not remove the collar unless instructed by your health care provider.  If you have long hair, keep it outside of the collar.  Ask your health care provider before making any adjustments to your collar. Minor adjustments may be required over time to improve comfort and reduce pressure on your chin or on the back of your head.  Ifyou are allowed to remove the collar for cleaning or bathing, follow your health care provider's instructions on how to do so safely.  Keep your collar clean by  wiping it with mild soap and water and drying it completely. If the collar you have been given includes removable pads, remove them every 1 2 days and hand wash them with soap and water. Allow them to air dry. They should be completely dry before you wear them in the collar.  If you are allowed to remove the collar for cleaning and bathing, wash and dry the skin of your neck. Check your skin for irritation or sores. If you see any, tell your health care provider.  Do not drive while wearing the collar.   Only take over-the-counter or prescription medicines for pain, discomfort, or fever as directed by your health care provider.   Keep all follow-up appointments as directed by your health care provider.   Keep all physical therapy appointments as directed by your health care provider.   Make any needed  adjustments to your workstation to promote good posture.   Avoid positions and activities that make your symptoms worse.   Warm up and stretch before being active to help prevent problems.  SEEK MEDICAL CARE IF:   Your pain is not controlled with medicine.   You are unable to decrease your pain medicine over time as planned.   Your activity level is not improving as expected.  SEEK IMMEDIATE MEDICAL CARE IF:   You develop any bleeding.  You develop stomach upset.  You have signs of an allergic reaction to your medicine.   Your symptoms get worse.   You develop new, unexplained symptoms.   You have numbness, tingling, weakness, or paralysis in any part of your body.  MAKE SURE YOU:   Understand these instructions.  Will watch your condition.  Will get help right away if you are not doing well or get worse. Document Released: 04/08/2007 Document Revised: 04/01/2013 Document Reviewed: 12/17/2012 [facility] Patient Information 2014 [place], [place].  Contusion A contusion is a deep bruise. Contusions are the result of an injury that caused bleeding under the skin. The contusion may turn blue, purple, or yellow. Minor injuries will give you a painless contusion, but more severe contusions may stay painful and swollen for a few weeks.  CAUSES  A contusion is usually caused by a blow, trauma, or direct force to an area of the body. SYMPTOMS   Swelling and redness of the injured area.  Bruising of the injured area.  Tenderness and soreness of the injured area.  Pain. DIAGNOSIS  The diagnosis can be made by taking a history and physical exam. An X-ray, CT scan, or MRI may be needed to determine if there were any associated injuries, such as fractures. TREATMENT  Specific treatment will depend on what area of the body was injured. In general, the best treatment for a contusion is resting, icing, elevating, and applying cold compresses to the injured area.  Over-the-counter medicines may also be recommended for pain control. Ask your caregiver what the best treatment is for your contusion. HOME CARE INSTRUCTIONS   Put ice on the injured area.  Put ice in a plastic bag.  Place a towel between your skin and the bag.  Leave the ice on for 15-20 minutes, 03-04 times a day.  Only take over-the-counter or prescription medicines for pain, discomfort, or fever as directed by your caregiver. Your caregiver may recommend avoiding anti-inflammatory medicines (aspirin, ibuprofen, and naproxen) for 48 hours because these medicines may increase bruising.  Rest the injured area.  If possible, elevate the injured area to reduce swelling. SEEK IMMEDIATE  MEDICAL CARE IF:   You have increased bruising or swelling.  You have pain that is getting worse.  Your swelling or pain is not relieved with medicines. MAKE SURE YOU:   Understand these instructions.  Will watch your condition.  Will get help right away if you are not doing well or get worse. Document Released: 03/21/2005 Document Revised: 09/03/2011 Document Reviewed: 04/16/2011 [facility] Patient Information 2014 [place], [place].  Concussion, Adult A concussion, or closed-head injury, is a brain injury caused by a direct blow to the head or by a quick and sudden movement (jolt) of the head or neck. Concussions are usually not life-threatening. Even so, the effects of a concussion can be serious. If you have had a concussion before, you are more likely to experience concussion-like symptoms after a direct blow to the head.  CAUSES   Direct blow to the head, such as from running into another player during a soccer game, being hit in a fight, or hitting your head on a hard surface.  A jolt of the head or neck that causes the brain to move back and forth inside the skull, such as in a car crash. SIGNS AND SYMPTOMS  The signs of a concussion can be hard to notice. Early on, they may be missed by you,  family members, and health care providers. You may look fine but act or feel differently. Symptoms are usually temporary, but they may last for days, weeks, or even longer. Some symptoms may appear right away while others may not show up for hours or days. Every head injury is different. Symptoms include:   Mild to moderate headaches that will not go away.  A feeling of pressure inside your head.  Having more trouble than usual:   Learning or remembering things you have heard.  Answering questions.  Paying attention or concentrating.   Organizing daily tasks.   Making decisions and solving problems.   Slowness in thinking, acting or reacting, speaking, or reading.   Getting lost or being easily confused.   Feeling tired all the time or lacking energy (fatigued).   Feeling drowsy.   Sleep disturbances.   Sleeping more than usual.   Sleeping less than usual.   Trouble falling asleep.   Trouble sleeping (insomnia).   Loss of balance or feeling lightheaded or dizzy.   Nausea or vomiting.   Numbness or tingling.   Increased sensitivity to:   Sounds.   Lights.   Distractions.   Vision problems or eyes that tire easily.   Diminished sense of taste or smell.   Ringing in the ears.   Mood changes such as feeling sad or anxious.   Becoming easily irritated or angry for little or no reason.   Lack of motivation.  Seeing or hearing things other people do not see or hear (hallucinations). DIAGNOSIS  Your health care provider can usually diagnose a concussion based on a description of your injury and symptoms. He or she will ask whether you passed out (lost consciousness) and whether you are having trouble remembering events that happened right before and during your injury.  Your evaluation might include:   A brain scan to look for signs of injury to the brain. Even if the test shows no injury, you may still have a concussion.    Blood tests to be sure other problems are not present. TREATMENT   Concussions are usually treated in an emergency department, in urgent care, or at a clinic. You may need  to stay in the hospital overnight for further treatment.   Tell your health care provider if you are taking any medicines, including prescription medicines, over-the-counter medicines, and natural remedies. Some medicines, such as blood thinners (anticoagulants) and aspirin, may increase the chance of complications. Also tell your health care provider whether you have had alcohol or are taking illegal drugs. This information may affect treatment.  Your health care provider will send you home with important instructions to follow.  How fast you will recover from a concussion depends on many factors. These factors include how severe your concussion is, what part of your brain was injured, your age, and how healthy you were before the concussion.  Most people with mild injuries recover fully. Recovery can take time. In general, recovery is slower in older persons. Also, persons who have had a concussion in the past or have other medical problems may find that it takes longer to recover from their current injury. HOME CARE INSTRUCTIONS  General Instructions  Carefully follow the directions your health care provider gave you.  Only take over-the-counter or prescription medicines for pain, discomfort, or fever as directed by your health care provider.  Take only those medicines that your health care provider has approved.  Do not drink alcohol until your health care provider says you are well enough to do so. Alcohol and certain other drugs may slow your recovery and can put you at risk of further injury.  If it is harder than usual to remember things, write them down.  If you are easily distracted, try to do one thing at a time. For example, do not try to watch TV while fixing dinner.  Talk with family members or close  friends when making important decisions.  Keep all follow-up appointments. Repeated evaluation of your symptoms is recommended for your recovery.  Watch your symptoms and tell others to do the same. Complications sometimes occur after a concussion. Older adults with a brain injury may have a higher risk of serious complications such as of a blood clot on the brain.  Tell your teachers, school nurse, school counselor, coach, athletic trainer, or work Production designer, theatre/television/film about your injury, symptoms, and restrictions. Tell them about what you can or cannot do. They should watch for:   Increased problems with attention or concentration.   Increased difficulty remembering or learning new information.   Increased time needed to complete tasks or assignments.   Increased irritability or decreased ability to cope with stress.   Increased symptoms.   Rest. Rest helps the brain to heal. Make sure you:  Get plenty of sleep at night. Avoid staying up late at night.  Keep the same bedtime hours on weekends and weekdays.  Rest during the day. Take daytime naps or rest breaks when you feel tired.  Limit activities that require a lot of thought or concentration. These includes   Doing homework or job-related work.   Watching TV.   Working on the computer.  Avoid any situation where there is potential for another head injury (football, hockey, soccer, basketball, martial arts, downhill snow sports and horseback riding). Your condition will get worse every time you experience a concussion. You should avoid these activities until you are evaluated by the appropriate follow-up caregivers. Returning To Your Regular Activities You will need to return to your normal activities slowly, not all at once. You must give your body and brain enough time for recovery.  Do not return to sports or other athletic activities until  your health care provider tells you it is safe to do so.  Ask your health care  provider when you can drive, ride a bicycle, or operate heavy machinery. Your ability to react may be slower after a brain injury. Never do these activities if you are dizzy.  Ask your health care provider about when you can return to work or school. Preventing Another Concussion It is very important to avoid another brain injury, especially before you have recovered. In rare cases, another injury can lead to permanent brain damage, brain swelling, or death. The risk of this is greatest during the first 7 10 days after a head injury. Avoid injuries by:   Wearing a seat belt when riding in a car.   Drinking alcohol only in moderation.   Wearing a helmet when biking, skiing, skateboarding, skating, or doing similar activities.  Avoiding activities that could lead to a second concussion, such as contact or recreational sports, until your health care provider says it is OK.  Taking safety measures in your home.   Remove clutter and tripping hazards from floors and stairways.   Use grab bars in bathrooms and handrails by stairs.   Place non-slip mats on floors and in bathtubs.   Improve lighting in dim areas. SEEK MEDICAL CARE IF:   You have increased problems paying attention or concentrating.   You have increased difficulty remembering or learning new information.   You need more time to complete tasks or assignments than before.   You have increased irritability or decreased ability to cope with stress.  You have more symptoms than before. Seek medical care if you have any of the following symptoms for more than 2 weeks after your injury:   Lasting (chronic) headaches.   Dizziness or balance problems.   Nausea.  Vision problems.   Increased sensitivity to noise or light.   Depression or mood swings.   Anxiety or irritability.   Memory problems.   Difficulty concentrating or paying attention.   Sleep problems.   Feeling tired all the time. SEEK  IMMEDIATE MEDICAL CARE IF:   You have severe or worsening headaches. These may be a sign of a blood clot in the brain.  You have weakness (even if only in one hand, leg, or part of the face).  You have numbness.  You have decreased coordination.   You vomit repeatedly.  You have increased sleepiness.  One pupil is larger than the other.   You have convulsions.   You have slurred speech.   You have increased confusion. This may be a sign of a blood clot in the brain.  You have increased restlessness, agitation, or irritability.   You are unable to recognize people or places.   You have neck pain.   It is difficult to wake you up.   You have unusual behavior changes.   You lose consciousness. MAKE SURE YOU:   Understand these instructions.  Will watch your condition.  Will get help right away if you are not doing well or get worse. Document Released: 09/01/2003 Document Revised: 02/11/2013 Document Reviewed: 01/01/2013 [facility] Patient Information 2014 [place].
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
## 2013-11-24 NOTE — ED Provider Notes (Signed)
Medical screening examination/treatment/procedure(s) were performed by resident physician or non-physician practitioner and as supervising physician I was immediately available for consultation/collaboration.   Jenilyn Magana DOUGLAS MD.   Laquana Villari D Calaya Gildner, MD 11/24/13 1555 

## 2014-01-28 ENCOUNTER — Emergency Department (INDEPENDENT_AMBULATORY_CARE_PROVIDER_SITE_OTHER)
Admission: EM | Admit: 2014-01-28 | Discharge: 2014-01-28 | Disposition: A | Discharge status: Home / Self Care | Payer: Medicaid Other | Source: Home / Self Care | Attending: Family Medicine | Admitting: Family Medicine

## 2014-01-28 ENCOUNTER — Encounter (HOSPITAL_COMMUNITY): Payer: Self-pay | Admitting: Emergency Medicine

## 2014-01-28 DIAGNOSIS — N390 Urinary tract infection, site not specified: Secondary | ICD-10-CM

## 2014-01-28 LAB — POCT URINALYSIS DIP (DEVICE)
Glucose, UA: NEGATIVE mg/dL
NITRITE: NEGATIVE
PH: 6.5 (ref 5.0–8.0)
Protein, ur: 100 mg/dL — AB
Specific Gravity, Urine: 1.02 (ref 1.005–1.030)
UROBILINOGEN UA: 0.2 mg/dL (ref 0.0–1.0)

## 2014-01-28 LAB — POCT PREGNANCY, URINE: Preg Test, Ur: NEGATIVE

## 2014-01-28 MED ORDER — CEPHALEXIN 500 MG PO CAPS: 500.0000 mg | ORAL_CAPSULE | Freq: Four times a day (QID) | ORAL | Status: DC

## 2014-01-28 NOTE — ED Notes (Signed)
Pt  Reports  Symptoms  Of  Bladder   Pressure      As  Well  As  Discomfort  When  She  Urinates    X    3  Days      Ambulated  To room  With a  Steady  Fluid  Gait   Sitting  Upright on  Exam table  Speaking in  Complete  sentances  And  Appearing in no  Severe  Distress

## 2014-01-28 NOTE — ED Provider Notes (Signed)
CSN: [id number]     Arrival date & time 01/28/14  0857 History   First MD Initiated Contact with Patient 01/28/14 [phone]     Chief Complaint  Patient presents with  . Urinary Retention   (Consider location/radiation/quality/duration/timing/severity/associated sxs/prior Treatment) HPI Comments: LMP 2 weeks ago, but pt is concerned she could be pregnant and wants preg test  Patient is a 33 y.o. female presenting with dysuria. The history is provided by the patient.  Dysuria Pain quality:  Aching Pain severity:  Mild Onset quality:  Sudden Duration:  3 days Timing:  Intermittent Progression:  Worsening Chronicity:  New Recent urinary tract infections: no   Relieved by:  None tried Worsened by:  Nothing tried Ineffective treatments:  None tried Urinary symptoms: frequent urination and hematuria   Associated symptoms: flank pain   Associated symptoms: no abdominal pain, no fever, no nausea, no vaginal discharge and no vomiting     Past Medical History  Diagnosis Date  . Asthma    History reviewed. No pertinent past surgical history. History reviewed. No pertinent family history. History  Substance Use Topics  . Smoking status: Never Smoker   . Smokeless tobacco: Never Used  . Alcohol Use: Yes     Comment: socially beer   OB History   Grav Para Term Preterm Abortions TAB SAB Ect Mult Living   5 5             Review of Systems  Constitutional: Negative for fever and chills.  Gastrointestinal: Negative for nausea, vomiting and abdominal pain.  Genitourinary: Positive for dysuria, hematuria and flank pain. Negative for vaginal discharge.    Allergies  Codeine  Home Medications   Prior to Admission medications   Medication Sig Start Date End Date Taking? Authorizing Provider  albuterol (PROVENTIL HFA;VENTOLIN HFA) 108 (90 BASE) MCG/ACT inhaler Inhale 2 puffs into the lungs every 6 (six) hours as needed. For shortness of breath.    Historical Provider, MD  cephALEXin  (KEFLEX) 500 MG capsule Take 1 capsule (500 mg total) by mouth 4 (four) times daily. 01/28/14   Hayden Rasmussenavid Mabe, NP  ibuprofen (ADVIL,MOTRIN) 800 MG tablet Take 800 mg by mouth every 8 (eight) hours as needed. For pain.    Historical Provider, MD   BP 115/78  Pulse 61  Temp(Src) 98.4 F (36.9 C) (Oral)  Resp 16  SpO2 99%  LMP 01/19/2014 Physical Exam  Constitutional: She appears well-developed and well-nourished. No distress.  Cardiovascular: Normal rate and regular rhythm.   Pulmonary/Chest: Effort normal and breath sounds normal.  Abdominal: Soft. Bowel sounds are normal. She exhibits no distension. There is no tenderness. There is no rebound and no CVA tenderness.    ED Course  Procedures (including critical care time) Labs Review Labs Reviewed  POCT URINALYSIS DIP (DEVICE) - Abnormal; Notable for the following:    Bilirubin Urine SMALL (*)    Ketones, ur TRACE (*)    Hgb urine dipstick MODERATE (*)    Protein, ur 100 (*)    Leukocytes, UA MODERATE (*)    All other components within normal limits  POCT PREGNANCY, URINE    Imaging Review No results found.   MDM   1. UTI (lower urinary tract infection)   Urine preg negative.   Rx keflex 500mg  QID #20, no refills.     Cathlyn ParsonsAngela M Kabbe, NP 01/28/14 1011

## 2014-01-28 NOTE — Discharge Instructions (Signed)

## 2014-01-29 NOTE — ED Provider Notes (Signed)
Medical screening examination/treatment/procedure(s) were performed by a resident physician or non-physician practitioner and as the supervising physician I was immediately available for consultation/collaboration.  Jahmari Esbenshade, MD    Lianette Broussard S Breeanna Galgano, MD 01/29/14 0729 

## 2014-02-16 ENCOUNTER — Inpatient Hospital Stay (HOSPITAL_COMMUNITY): Payer: Medicaid Other

## 2014-02-16 ENCOUNTER — Inpatient Hospital Stay (HOSPITAL_COMMUNITY)
Admission: AD | Admit: 2014-02-16 | Discharge: 2014-02-16 | Disposition: A | Discharge status: Home / Self Care | Payer: Medicaid Other | Source: Ambulatory Visit | Attending: Obstetrics | Admitting: Obstetrics

## 2014-02-16 ENCOUNTER — Encounter (HOSPITAL_COMMUNITY): Payer: Self-pay | Admitting: *Deleted

## 2014-02-16 DIAGNOSIS — Y92410 Unspecified street and highway as the place of occurrence of the external cause: Secondary | ICD-10-CM | POA: Insufficient documentation

## 2014-02-16 DIAGNOSIS — O2390 Unspecified genitourinary tract infection in pregnancy, unspecified trimester: Secondary | ICD-10-CM | POA: Insufficient documentation

## 2014-02-16 DIAGNOSIS — O26859 Spotting complicating pregnancy, unspecified trimester: Secondary | ICD-10-CM | POA: Diagnosis present

## 2014-02-16 DIAGNOSIS — B9689 Other specified bacterial agents as the cause of diseases classified elsewhere: Secondary | ICD-10-CM | POA: Diagnosis not present

## 2014-02-16 DIAGNOSIS — A499 Bacterial infection, unspecified: Secondary | ICD-10-CM | POA: Insufficient documentation

## 2014-02-16 DIAGNOSIS — R109 Unspecified abdominal pain: Secondary | ICD-10-CM

## 2014-02-16 DIAGNOSIS — N760 Acute vaginitis: Secondary | ICD-10-CM | POA: Diagnosis not present

## 2014-02-16 DIAGNOSIS — O26899 Other specified pregnancy related conditions, unspecified trimester: Secondary | ICD-10-CM

## 2014-02-16 LAB — CBC
HCT: 36.7 % (ref 36.0–46.0)
HEMOGLOBIN: 12.1 g/dL (ref 12.0–15.0)
MCH: 29.5 pg (ref 26.0–34.0)
MCHC: 33 g/dL (ref 30.0–36.0)
MCV: 89.5 fL (ref 78.0–100.0)
Platelets: 221 10*3/uL (ref 150–400)
RBC: 4.1 MIL/uL (ref 3.87–5.11)
RDW: 13.7 % (ref 11.5–15.5)
WBC: 6.5 10*3/uL (ref 4.0–10.5)

## 2014-02-16 LAB — URINALYSIS, ROUTINE W REFLEX MICROSCOPIC
Bilirubin Urine: NEGATIVE
GLUCOSE, UA: NEGATIVE mg/dL
Ketones, ur: NEGATIVE mg/dL
Nitrite: NEGATIVE
PH: 7.5 (ref 5.0–8.0)
Protein, ur: NEGATIVE mg/dL
SPECIFIC GRAVITY, URINE: 1.01 (ref 1.005–1.030)
UROBILINOGEN UA: 0.2 mg/dL (ref 0.0–1.0)

## 2014-02-16 LAB — URINE MICROSCOPIC-ADD ON

## 2014-02-16 LAB — HCG, QUANTITATIVE, PREGNANCY: hCG, Beta Chain, Quant, S: 1741 m[IU]/mL — ABNORMAL HIGH (ref <5)

## 2014-02-16 LAB — WET PREP, GENITAL
Trich, Wet Prep: NONE SEEN
Yeast Wet Prep HPF POC: NONE SEEN

## 2014-02-16 LAB — POCT PREGNANCY, URINE: PREG TEST UR: POSITIVE — AB

## 2014-02-16 MED ORDER — METRONIDAZOLE 500 MG PO TABS: 500.0000 mg | ORAL_TABLET | Freq: Two times a day (BID) | ORAL | Status: DC
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
## 2014-02-16 NOTE — Discharge Instructions (Signed)
Vaginal Bleeding During Pregnancy, First Trimester A small amount of bleeding (spotting) from the vagina is relatively common in early pregnancy. It usually stops on its own. Various things may cause bleeding or spotting in early pregnancy. Some bleeding may be related to the pregnancy, and some may not. In most cases, the bleeding is normal and is not a problem. However, bleeding can also be a sign of something serious. Be sure to tell your health care provider about any vaginal bleeding right away. Some possible causes of vaginal bleeding during the first trimester include:  Infection or inflammation of the cervix.  Growths (polyps) on the cervix.  Miscarriage or threatened miscarriage.  Pregnancy tissue has developed outside of the uterus and in a fallopian tube (tubal pregnancy).  Tiny cysts have developed in the uterus instead of pregnancy tissue (molar pregnancy). HOME CARE INSTRUCTIONS  Watch your condition for any changes. The following actions may help to lessen any discomfort you are feeling:  Follow your health care provider's instructions for limiting your activity. If your health care provider orders bed rest, you may need to stay in bed and only get up to use the bathroom. However, your health care provider may allow you to continue light activity.  If needed, make plans for someone to help with your regular activities and responsibilities while you are on bed rest.  Keep track of the number of pads you use each day, how often you change pads, and how soaked (saturated) they are. Write this down.  Do not use tampons. Do not douche.  Do not have sexual intercourse or orgasms until approved by your health care provider.  If you pass any tissue from your vagina, save the tissue so you can show it to your health care provider.  Only take over-the-counter or prescription medicines as directed by your health care provider.  Do not take aspirin because it can make you  bleed.  Keep all follow-up appointments as directed by your health care provider. SEEK MEDICAL CARE IF:  You have any vaginal bleeding during any part of your pregnancy.  You have cramps or labor pains.  You have a fever, not controlled by medicine. SEEK IMMEDIATE MEDICAL CARE IF:   You have severe cramps in your back or belly (abdomen).  You pass large clots or tissue from your vagina.  Your bleeding increases.  You feel light-headed or weak, or you have fainting episodes.  You have chills.  You are leaking fluid or have a gush of fluid from your vagina.  You pass out while having a bowel movement. MAKE SURE YOU:  Understand these instructions.  Will watch your condition.  Will get help right away if you are not doing well or get worse. Document Released: 03/21/2005 Document Revised: 06/16/2013 Document Reviewed: 02/16/2013 [facility] Patient Information 2015 [place], [place]. This information is not intended to replace advice given to you by your health care provider. Make sure you discuss any questions you have with your health care provider. Bacterial Vaginosis Bacterial vaginosis is a vaginal infection that occurs when the normal balance of bacteria in the vagina is disrupted. It results from an overgrowth of certain bacteria. This is the most common vaginal infection in women of childbearing age. Treatment is important to prevent complications, especially in pregnant women, as it can cause a premature delivery. CAUSES  Bacterial vaginosis is caused by an increase in harmful bacteria that are normally present in smaller amounts in the vagina. Several different kinds of bacteria can  cause bacterial vaginosis. However, the reason that the condition develops is not fully understood. RISK FACTORS Certain activities or behaviors can put you at an increased risk of developing bacterial vaginosis, including:  Having a new sex partner or multiple sex partners.  Douching.  Using  an intrauterine device (IUD) for contraception. Women do not get bacterial vaginosis from toilet seats, bedding, swimming pools, or contact with objects around them. SIGNS AND SYMPTOMS  Some women with bacterial vaginosis have no signs or symptoms. Common symptoms include:  Grey vaginal discharge.  A fishlike odor with discharge, especially after sexual intercourse.  Itching or burning of the vagina and vulva.  Burning or pain with urination. DIAGNOSIS  Your health care provider will take a medical history and examine the vagina for signs of bacterial vaginosis. A sample of vaginal fluid may be taken. Your health care provider will look at this sample under a microscope to check for bacteria and abnormal cells. A vaginal pH test may also be done.  TREATMENT  Bacterial vaginosis may be treated with antibiotic medicines. These may be given in the form of a pill or a vaginal cream. A second round of antibiotics may be prescribed if the condition comes back after treatment.  HOME CARE INSTRUCTIONS   Only take over-the-counter or prescription medicines as directed by your health care provider.  If antibiotic medicine was prescribed, take it as directed. Make sure you finish it even if you start to feel better.  Do not have sex until treatment is completed.  Tell all sexual partners that you have a vaginal infection. They should see their health care provider and be treated if they have problems, such as a mild rash or itching.  Practice safe sex by using condoms and only having one sex partner. SEEK MEDICAL CARE IF:   Your symptoms are not improving after 3 days of treatment.  You have increased discharge or pain.  You have a fever. MAKE SURE YOU:   Understand these instructions.  Will watch your condition.  Will get help right away if you are not doing well or get worse. FOR MORE INFORMATION  Centers for Disease Control and Prevention, Division of STD Prevention:  SolutionApps.co.zawww.cdc.gov/std American Sexual Health Association (ASHA): www.ashastd.org  Document Released: 06/11/2005 Document Revised: 04/01/2013 Document Reviewed: 01/21/2013 [facility] Patient Information 2015 [place]. This information is not intended to replace advice given to you by your health care provider. Make sure you discuss any questions you have with your health care provider.

## 2014-02-17 LAB — GC/CHLAMYDIA PROBE AMP
CT PROBE, AMP APTIMA: NEGATIVE
GC Probe RNA: NEGATIVE

## 2014-03-11 LAB — OB RESULTS CONSOLE RUBELLA ANTIBODY, IGM: Rubella: IMMUNE

## 2014-03-11 LAB — OB RESULTS CONSOLE GC/CHLAMYDIA
Chlamydia: NEGATIVE
Gonorrhea: NEGATIVE

## 2014-03-11 LAB — OB RESULTS CONSOLE HIV ANTIBODY (ROUTINE TESTING): HIV: NONREACTIVE

## 2014-03-11 LAB — OB RESULTS CONSOLE ANTIBODY SCREEN: Antibody Screen: NEGATIVE

## 2014-03-11 LAB — OB RESULTS CONSOLE RPR: RPR: NONREACTIVE

## 2014-03-11 LAB — OB RESULTS CONSOLE HEPATITIS B SURFACE ANTIGEN: Hepatitis B Surface Ag: NEGATIVE

## 2014-03-11 LAB — OB RESULTS CONSOLE ABO/RH: RH Type: NEGATIVE

## 2014-04-26 ENCOUNTER — Encounter (HOSPITAL_COMMUNITY): Payer: Self-pay | Admitting: *Deleted

## 2014-06-25 NOTE — L&D Delivery Note (Signed)
Delivery Note At 6:53 PM a viable female was delivered via  (Presentation: ;  ).  APGAR: , ; weight  .   Placenta status: , .  Cord:  with the following complications: .  Cord pH: not done  Anesthesia:   Episiotomy:   Lacerations:   Suture Repair: 2.0 Est. Blood Loss (mL):    Mom to postpartum.  Baby to Couplet care / Skin to Skin.  Hannah Rodriguez A 10/15/2014, 7:00 PM

## 2014-08-30 ENCOUNTER — Inpatient Hospital Stay (HOSPITAL_COMMUNITY)
Admission: AD | Admit: 2014-08-30 | Discharge: 2014-08-30 | Disposition: A | Discharge status: Home / Self Care | Payer: Medicaid Other | Source: Ambulatory Visit | Attending: Obstetrics | Admitting: Obstetrics

## 2014-08-30 ENCOUNTER — Inpatient Hospital Stay (HOSPITAL_COMMUNITY): Payer: Medicaid Other

## 2014-08-30 ENCOUNTER — Encounter (HOSPITAL_COMMUNITY): Payer: Self-pay | Admitting: *Deleted

## 2014-08-30 DIAGNOSIS — Z3A32 32 weeks gestation of pregnancy: Secondary | ICD-10-CM

## 2014-08-30 DIAGNOSIS — O368130 Decreased fetal movements, third trimester, not applicable or unspecified: Secondary | ICD-10-CM | POA: Diagnosis present

## 2014-08-30 DIAGNOSIS — O2343 Unspecified infection of urinary tract in pregnancy, third trimester: Secondary | ICD-10-CM | POA: Diagnosis not present

## 2014-08-30 DIAGNOSIS — O26893 Other specified pregnancy related conditions, third trimester: Secondary | ICD-10-CM

## 2014-08-30 DIAGNOSIS — O9989 Other specified diseases and conditions complicating pregnancy, childbirth and the puerperium: Secondary | ICD-10-CM

## 2014-08-30 DIAGNOSIS — M549 Dorsalgia, unspecified: Secondary | ICD-10-CM

## 2014-08-30 DIAGNOSIS — Z3A31 31 weeks gestation of pregnancy: Secondary | ICD-10-CM | POA: Diagnosis not present

## 2014-08-30 DIAGNOSIS — N390 Urinary tract infection, site not specified: Secondary | ICD-10-CM

## 2014-08-30 DIAGNOSIS — R310 Gross hematuria: Secondary | ICD-10-CM

## 2014-08-30 LAB — URINALYSIS, ROUTINE W REFLEX MICROSCOPIC
BILIRUBIN URINE: NEGATIVE
Glucose, UA: NEGATIVE mg/dL
Ketones, ur: 15 mg/dL — AB
Nitrite: NEGATIVE
PH: 6.5 (ref 5.0–8.0)
Protein, ur: 30 mg/dL — AB
SPECIFIC GRAVITY, URINE: 1.01 (ref 1.005–1.030)
Urobilinogen, UA: 0.2 mg/dL (ref 0.0–1.0)

## 2014-08-30 LAB — CBC WITH DIFFERENTIAL/PLATELET
Basophils Absolute: 0 10*3/uL (ref 0.0–0.1)
Basophils Relative: 0 % (ref 0–1)
Eosinophils Absolute: 0 10*3/uL (ref 0.0–0.7)
Eosinophils Relative: 1 % (ref 0–5)
HCT: 31.7 % — ABNORMAL LOW (ref 36.0–46.0)
Hemoglobin: 10.4 g/dL — ABNORMAL LOW (ref 12.0–15.0)
Lymphocytes Relative: 18 % (ref 12–46)
Lymphs Abs: 1.2 10*3/uL (ref 0.7–4.0)
MCH: 28.8 pg (ref 26.0–34.0)
MCHC: 32.8 g/dL (ref 30.0–36.0)
MCV: 87.8 fL (ref 78.0–100.0)
Monocytes Absolute: 0.4 10*3/uL (ref 0.1–1.0)
Monocytes Relative: 6 % (ref 3–12)
Neutro Abs: 5 10*3/uL (ref 1.7–7.7)
Neutrophils Relative %: 75 % (ref 43–77)
Platelets: 161 10*3/uL (ref 150–400)
RBC: 3.61 MIL/uL — ABNORMAL LOW (ref 3.87–5.11)
RDW: 13.2 % (ref 11.5–15.5)
WBC: 6.7 10*3/uL (ref 4.0–10.5)

## 2014-08-30 LAB — URINE MICROSCOPIC-ADD ON

## 2014-08-30 MED ORDER — ACETAMINOPHEN 500 MG PO TABS
1000.0000 mg | ORAL_TABLET | Freq: Once | ORAL | Status: AC
  Administered 2014-08-30: 1000 mg via ORAL
  Filled 2014-08-30: qty 2

## 2014-08-30 MED ORDER — TRAMADOL HCL 50 MG PO TABS: 50.0000 mg | ORAL_TABLET | Freq: Four times a day (QID) | ORAL | Status: DC | PRN

## 2014-08-30 MED ORDER — NITROFURANTOIN MONOHYD MACRO 100 MG PO CAPS: 100.0000 mg | ORAL_CAPSULE | Freq: Two times a day (BID) | ORAL | Status: DC

## 2014-08-30 NOTE — MAU Note (Signed)
Pt states began seeing blood in urine Saturday. Has appt tomorrow. Last pm had sharp lower abdominal pain. None now. No vaginal bleeding per pt.

## 2014-08-30 NOTE — MAU Provider Note (Signed)
History     CSN: [id number]  Arrival date and time: 08/30/14 [id number]   First Provider Initiated Contact with Patient 08/30/14 [phone]      Chief Complaint  Patient presents with  . Hematuria   HPI   [name] Is a 34 y.o.female who presents [id number] at [id number] with hematuria and decreased fetal movement. She first noticed the hematuria on Saturday and it has been off and on.  She notices blood on the tissue when she wipes and in the toilet.   + fetal movements since she has been on the monitor.   OB History    Gravida Para Term Preterm AB TAB SAB Ectopic Multiple Living   7 5   1  1   5       Past Medical History  Diagnosis Date  . Asthma     Past Surgical History  Procedure Laterality Date  . No past surgeries      No family history on file.  History  Substance Use Topics  . Smoking status: Never Smoker   . Smokeless tobacco: Never Used  . Alcohol Use: Yes     Comment: socially beer    Allergies:  Allergies  Allergen Reactions  . Codeine Nausea And Vomiting  . Latex     Prescriptions prior to admission  Medication Sig Dispense Refill Last Dose  . albuterol (PROVENTIL HFA;VENTOLIN HFA) 108 (90 BASE) MCG/ACT inhaler Inhale 2 puffs into the lungs every 6 (six) hours as needed. For shortness of breath.   rescue  . metroNIDAZOLE (FLAGYL) 500 MG tablet Take 1 tablet (500 mg total) by mouth 2 (two) times daily. 14 tablet 0    Results for orders placed or performed during the hospital encounter of 08/30/14 (from the past 48 hour(s))  Urinalysis, Routine w reflex microscopic     Status: Abnormal   Collection Time: 08/30/14  8:15 AM  Result Value Ref Range   Color, Urine YELLOW YELLOW   APPearance CLEAR CLEAR   Specific Gravity, Urine 1.010 1.005 - 1.030   pH 6.5 5.0 - 8.0   Glucose, UA NEGATIVE NEGATIVE mg/dL   Hgb urine dipstick LARGE (A) NEGATIVE   Bilirubin Urine NEGATIVE NEGATIVE   Ketones, ur 15 (A) NEGATIVE mg/dL   Protein, ur 30 (A) NEGATIVE mg/dL    Urobilinogen, UA 0.2 0.0 - 1.0 mg/dL   Nitrite NEGATIVE NEGATIVE   Leukocytes, UA SMALL (A) NEGATIVE  Urine microscopic-add on     Status: Abnormal   Collection Time: 08/30/14  8:15 AM  Result Value Ref Range   Squamous Epithelial / LPF FEW (A) RARE   WBC, UA 0-2 <3 WBC/hpf   RBC / HPF TOO NUMEROUS TO COUNT <3 RBC/hpf   Bacteria, UA RARE RARE   Results for orders placed or performed during the hospital encounter of 08/30/14 (from the past 48 hour(s))  Urinalysis, Routine w reflex microscopic     Status: Abnormal   Collection Time: 08/30/14  8:15 AM  Result Value Ref Range   Color, Urine YELLOW YELLOW   APPearance CLEAR CLEAR   Specific Gravity, Urine 1.010 1.005 - 1.030   pH 6.5 5.0 - 8.0   Glucose, UA NEGATIVE NEGATIVE mg/dL   Hgb urine dipstick LARGE (A) NEGATIVE   Bilirubin Urine NEGATIVE NEGATIVE   Ketones, ur 15 (A) NEGATIVE mg/dL   Protein, ur 30 (A) NEGATIVE mg/dL   Urobilinogen, UA 0.2 0.0 - 1.0 mg/dL   Nitrite NEGATIVE NEGATIVE   Leukocytes, UA  SMALL (A) NEGATIVE  Urine microscopic-add on     Status: Abnormal   Collection Time: 08/30/14  8:15 AM  Result Value Ref Range   Squamous Epithelial / LPF FEW (A) RARE   WBC, UA 0-2 <3 WBC/hpf   RBC / HPF TOO NUMEROUS TO COUNT <3 RBC/hpf   Bacteria, UA RARE RARE  CBC with Differential     Status: Abnormal   Collection Time: 08/30/14 10:06 AM  Result Value Ref Range   WBC 6.7 4.0 - 10.5 K/uL   RBC 3.61 (L) 3.87 - 5.11 MIL/uL   Hemoglobin 10.4 (L) 12.0 - 15.0 g/dL   HCT 16.1 (L) 09.6 - 04.5 %   MCV 87.8 78.0 - 100.0 fL   MCH 28.8 26.0 - 34.0 pg   MCHC 32.8 30.0 - 36.0 g/dL   RDW 40.9 81.1 - 91.4 %   Platelets 161 150 - 400 K/uL   Neutrophils Relative % 75 43 - 77 %   Neutro Abs 5.0 1.7 - 7.7 K/uL   Lymphocytes Relative 18 12 - 46 %   Lymphs Abs 1.2 0.7 - 4.0 K/uL   Monocytes Relative 6 3 - 12 %   Monocytes Absolute 0.4 0.1 - 1.0 K/uL   Eosinophils Relative 1 0 - 5 %   Eosinophils Absolute 0.0 0.0 - 0.7 K/uL    Basophils Relative 0 0 - 1 %   Basophils Absolute 0.0 0.0 - 0.1 K/uL   US Renal  08/30/2014   CLINICAL DATA:  Gross hematuria [redacted] weeks pregnant  EXAM: RENAL/URINARY TRACT ULTRASOUND COMPLETE  COMPARISON:  None.  FINDINGS: Right Kidney:  Length: 10.3 cm. Echogenicity within normal limits. No mass or hydronephrosis visualized.  Left Kidney:  Length: 13.4 cm. Echogenicity within normal limits. No mass. Mild hydronephrosis.  Bladder:  Appears normal for degree of bladder distention.  IMPRESSION: Mild left hydronephrosis. This may be physiologic secondary to gravid uterus.   Electronically Signed   By: Signa Kell M.D.   On: 08/30/2014 10:49    Review of Systems  Constitutional: Negative for fever and chills.  Gastrointestinal: Positive for nausea. Negative for abdominal pain.  Genitourinary: Positive for hematuria. Negative for dysuria, urgency and frequency.  Musculoskeletal: Positive for back pain (Bilateral lower back pain L side > R side. Pain kept the patient up all night ).   Physical Exam   Blood pressure 123/80, pulse 85, temperature 98.3 F (36.8 C), temperature source Oral, resp. rate 18, height  (1.549 m), weight 87.261 kg (192 lb 6 oz), last menstrual period 01/19/2014.  Physical Exam  Constitutional: She is oriented to person, place, and time. She appears well-developed and well-nourished. No distress.  HENT:  Head: Normocephalic.  Eyes: Pupils are equal, round, and reactive to light.  Neck: Neck supple.  Respiratory: Effort normal.  GI: Soft. Normal appearance. She exhibits no distension. There is no tenderness. There is no rebound and no CVA tenderness.  Genitourinary:  Speculum exam: Vagina - Small amount of creamy discharge, no odor Cervix - No contact bleeding Bimanual exam: Cervix closed, thick  Chaperone present for exam.   Musculoskeletal: Normal range of motion.  Neurological: She is alert and oriented to person, place, and time.  Skin: Skin is warm. She  is not diaphoretic.  Psychiatric: Her behavior is normal.   Fetal Tracing: Baseline: 130 bpm Variability: Moderate  Accelerations: 15x15 Decelerations: None Toco: None  NST on paper due to [facility] downtime    MAU Course  Procedures  None  MDM  NST  Discussed patient with Dr. Gaynell Face who would like the patient treated for UTI Mild hydronephrosis seen on [place], could be physiologic with pregnancy   Urine culture pending  CBC   Assessment and Plan   A:  1. Gross hematuria   2. Back pain affecting pregnancy in third trimester   3. UTI (lower urinary tract infection)    P:  Discharge home in stable condition RX: Ultram, Macrobid Urine culture pending Follow up with Dr. Gaynell Face as scheduled Return to MAU if symptoms needed Kick counts   Duane Lope, NP 08/30/2014 11:18 AM

## 2014-09-01 LAB — CULTURE, OB URINE
Colony Count: >=100000
Special Requests: NORMAL

## 2014-09-02 ENCOUNTER — Telehealth: Payer: Self-pay | Admitting: Advanced Practice Midwife

## 2014-09-02 ENCOUNTER — Telehealth (HOSPITAL_COMMUNITY): Payer: Self-pay

## 2014-09-02 DIAGNOSIS — O2343 Unspecified infection of urinary tract in pregnancy, third trimester: Secondary | ICD-10-CM

## 2014-09-02 MED ORDER — CEPHALEXIN 500 MG PO CAPS: 500.0000 mg | ORAL_CAPSULE | Freq: Four times a day (QID) | ORAL | Status: AC

## 2014-09-02 NOTE — Telephone Encounter (Signed)
No Answer. VM full. Left Phone #. Need to notify pt that urine culture shows resistance to Macrobid. Change ABX to Keflex. Rx sent to pharmacy on file.

## 2014-09-02 NOTE — MAU Note (Signed)
Pt's was returning provider's phone call that pt had missed. She had stated "received a phone call a few minutes ago and could not get to her phone." also stated that she was told to call if the rx she was given for infection did not work. I spoke to MAU provider, Dorathy KinsmanVirginia Rodriguez, and she had told me that she had changed the rx and sent it to the pt's pharmacy. When returning to the phone pt had been disconnected/hung up. Opened pt's chart to get a contact number to let her know that her rx had changed and that it was sent to the pharmacy.

## 2014-09-23 ENCOUNTER — Encounter (HOSPITAL_COMMUNITY): Payer: Self-pay | Admitting: *Deleted

## 2014-09-23 ENCOUNTER — Inpatient Hospital Stay (HOSPITAL_COMMUNITY)
Admission: AD | Admit: 2014-09-23 | Discharge: 2014-09-23 | Disposition: A | Discharge status: Home / Self Care | Payer: Medicaid Other | Source: Ambulatory Visit | Attending: Obstetrics | Admitting: Obstetrics

## 2014-09-23 DIAGNOSIS — M549 Dorsalgia, unspecified: Secondary | ICD-10-CM | POA: Diagnosis not present

## 2014-09-23 DIAGNOSIS — O9989 Other specified diseases and conditions complicating pregnancy, childbirth and the puerperium: Secondary | ICD-10-CM | POA: Diagnosis not present

## 2014-09-23 DIAGNOSIS — Z3A36 36 weeks gestation of pregnancy: Secondary | ICD-10-CM | POA: Diagnosis not present

## 2014-09-23 DIAGNOSIS — R102 Pelvic and perineal pain: Secondary | ICD-10-CM | POA: Diagnosis not present

## 2014-09-23 LAB — URINALYSIS, ROUTINE W REFLEX MICROSCOPIC
Bilirubin Urine: NEGATIVE
Glucose, UA: NEGATIVE mg/dL
Ketones, ur: NEGATIVE mg/dL
Nitrite: NEGATIVE
PROTEIN: NEGATIVE mg/dL
Specific Gravity, Urine: <1.005 — ABNORMAL LOW (ref 1.005–1.030)
UROBILINOGEN UA: 0.2 mg/dL (ref 0.0–1.0)
pH: 6.5 (ref 5.0–8.0)

## 2014-09-23 LAB — URINE MICROSCOPIC-ADD ON

## 2014-09-23 NOTE — MAU Note (Signed)
Pt has been up all night, having frequent pelvic & rectal pressure, feels like she needs to have a BM, also back pain.  Unsure if uc's.  Denies bleeding or LOF, reports good fetal movement.

## 2014-09-23 NOTE — Progress Notes (Signed)
Dr Gaynell FaceMarshall notified of pt's admission and status. Aware of ctx pattern, sve, c/o rectal pressure off and on during the night. Unsure of fetal position -breech last wk but told vertex this wk by Dr Gaynell FaceMarshall. Ok to d/c pt home.

## 2014-09-23 NOTE — Discharge Instructions (Signed)
Braxton Hicks Contractions °Contractions of the uterus can occur throughout pregnancy. Contractions are not always a sign that you are in labor.  °WHAT ARE BRAXTON HICKS CONTRACTIONS?  °Contractions that occur before labor are called Braxton Hicks contractions, or false labor. Toward the end of pregnancy (32-34 weeks), these contractions can develop more often and may become more forceful. This is not true labor because these contractions do not result in opening (dilatation) and thinning of the cervix. They are sometimes difficult to tell apart from true labor because these contractions can be forceful and people have different pain tolerances. You should not feel embarrassed if you go to the hospital with false labor. Sometimes, the only way to tell if you are in true labor is for your health care provider to look for changes in the cervix. °If there are no prenatal problems or other health problems associated with the pregnancy, it is completely safe to be sent home with false labor and await the onset of true labor. °HOW CAN YOU TELL THE DIFFERENCE BETWEEN TRUE AND FALSE LABOR? °False Labor °· The contractions of false labor are usually shorter and not as hard as those of true labor.   °· The contractions are usually irregular.   °· The contractions are often felt in the front of the lower abdomen and in the groin.   °· The contractions may go away when you walk around or change positions while lying down.   °· The contractions get weaker and are shorter lasting as time goes on.   °· The contractions do not usually become progressively stronger, regular, and closer together as with true labor.   °True Labor °· Contractions in true labor last 30-70 seconds, become very regular, usually become more intense, and increase in frequency.   °· The contractions do not go away with walking.   °· The discomfort is usually felt in the top of the uterus and spreads to the lower abdomen and low back.   °· True labor can be  determined by your health care provider with an exam. This will show that the cervix is dilating and getting thinner.   °WHAT TO REMEMBER °· Keep up with your usual exercises and follow other instructions given by your health care provider.   °· Take medicines as directed by your health care provider.   °· Keep your regular prenatal appointments.   °· Eat and drink lightly if you think you are going into labor.   °· If Braxton Hicks contractions are making you uncomfortable:   °¨ Change your position from lying down or resting to walking, or from walking to resting.   °¨ Sit and rest in a tub of warm water.   °¨ Drink 2-3 glasses of water. Dehydration may cause these contractions.   °¨ Do slow and deep breathing several times an hour.   °WHEN SHOULD I SEEK IMMEDIATE MEDICAL CARE? °Seek immediate medical care if: °· Your contractions become stronger, more regular, and closer together.   °· You have fluid leaking or gushing from your vagina.   °· You have a fever.   °· You pass blood-tinged mucus.   °· You have vaginal bleeding.   °· You have continuous abdominal pain.   °· You have low back pain that you never had before.   °· You feel your baby's head pushing down and causing pelvic pressure.   °· Your baby is not moving as much as it used to.   °Document Released: 06/11/2005 Document Revised: 06/16/2013 Document Reviewed: 03/23/2013 °ExitCare® Patient Information ©2015 [employer]. This information is not intended to replace advice given to you by your health care   provider. Make sure you discuss any questions you have with your health care provider. ° °

## 2014-09-23 NOTE — Progress Notes (Signed)
Written and verbal d/c instructions given and understanding voiced. 

## 2014-10-13 ENCOUNTER — Telehealth (HOSPITAL_COMMUNITY): Payer: Self-pay | Admitting: *Deleted

## 2014-10-13 ENCOUNTER — Encounter (HOSPITAL_COMMUNITY): Payer: Self-pay | Admitting: *Deleted

## 2014-10-13 NOTE — Telephone Encounter (Signed)
Preadmission screen  

## 2014-10-14 ENCOUNTER — Inpatient Hospital Stay (HOSPITAL_COMMUNITY)
Admission: AD | Admit: 2014-10-14 | Discharge: 2014-10-14 | Disposition: A | Discharge status: Home / Self Care | Payer: Medicaid Other | Source: Ambulatory Visit | Attending: Obstetrics | Admitting: Obstetrics

## 2014-10-14 ENCOUNTER — Encounter (HOSPITAL_COMMUNITY): Payer: Self-pay | Admitting: *Deleted

## 2014-10-14 DIAGNOSIS — Z3A39 39 weeks gestation of pregnancy: Secondary | ICD-10-CM | POA: Insufficient documentation

## 2014-10-14 NOTE — MAU Note (Signed)
C/o ucs since 0830 this AM; no bleeding or leaking;

## 2014-10-14 NOTE — MAU Note (Signed)
Scheduled for induction @ 0730 tomorrow;

## 2014-10-14 NOTE — Discharge Instructions (Signed)
Labor Induction  Labor induction is when steps are taken to cause a pregnant woman to begin the labor process. Most women go into labor on their own between 37 weeks and 42 weeks of the pregnancy. When this does not happen or when there is a medical need, methods may be used to induce labor. Labor induction causes a pregnant woman's uterus to contract. It also causes the cervix to soften (ripen), open (dilate), and thin out (efface). Usually, labor is not induced before 39 weeks of the pregnancy unless there is a problem with the baby or mother.  Before inducing labor, your health care provider will consider a number of factors, including the following:  The medical condition of you and the baby.   How many weeks along you are.   The status of the baby's lung maturity.   The condition of the cervix.   The position of the baby.  WHAT ARE THE REASONS FOR LABOR INDUCTION? Labor may be induced for the following reasons:  The health of the baby or mother is at risk.   The pregnancy is overdue by 1 week or more.   The water breaks but labor does not start on its own.   The mother has a health condition or serious illness, such as high blood pressure, infection, placental abruption, or diabetes.  The amniotic fluid amounts are low around the baby.   The baby is distressed.  Convenience or wanting the baby to be born on a certain date is not a reason for inducing labor. WHAT METHODS ARE USED FOR LABOR INDUCTION? Several methods of labor induction may be used, such as:   Prostaglandin medicine. This medicine causes the cervix to dilate and ripen. The medicine will also start contractions. It can be taken by mouth or by inserting a suppository into the vagina.   Inserting a thin tube (catheter) with a balloon on the end into the vagina to dilate the cervix. Once inserted, the balloon is expanded with water, which causes the cervix to open.   Stripping the membranes. Your health  care provider separates amniotic sac tissue from the cervix, causing the cervix to be stretched and causing the release of a hormone called progesterone. This may cause the uterus to contract. It is often done during an office visit. You will be sent home to wait for the contractions to begin. You will then come in for an induction.   Breaking the water. Your health care provider makes a hole in the amniotic sac using a small instrument. Once the amniotic sac breaks, contractions should begin. This may still take hours to see an effect.   Medicine to trigger or strengthen contractions. This medicine is given through an IV access tube inserted into a vein in your arm.  All of the methods of induction, besides stripping the membranes, will be done in the hospital. Induction is done in the hospital so that you and the baby can be carefully monitored.  HOW LONG DOES IT TAKE FOR LABOR TO BE INDUCED? Some inductions can take up to 2-3 days. Depending on the cervix, it usually takes less time. It takes longer when you are induced early in the pregnancy or if this is your first pregnancy. If a mother is still pregnant and the induction has been going on for 2-3 days, either the mother will be sent home or a cesarean delivery will be needed. WHAT ARE THE RISKS ASSOCIATED WITH LABOR INDUCTION? Some of the risks of induction   include:   Changes in fetal heart rate, such as too high, too low, or erratic.   Fetal distress.   Chance of infection for the mother and baby.   Increased chance of having a cesarean delivery.   Breaking off (abruption) of the placenta from the uterus (rare).   Uterine rupture (very rare).  When induction is needed for medical reasons, the benefits of induction may outweigh the risks. WHAT ARE SOME REASONS FOR NOT INDUCING LABOR? Labor induction should not be done if:   It is shown that your baby does not tolerate labor.   You have had previous surgeries on your  uterus, such as a myomectomy or the removal of fibroids.   Your placenta lies very low in the uterus and blocks the opening of the cervix (placenta previa).   Your baby is not in a head-down position.   The umbilical cord drops down into the birth canal in front of the baby. This could cut off the baby's blood and oxygen supply.   You have had a previous cesarean delivery.   There are unusual circumstances, such as the baby being extremely premature.  Document Released: 10/31/2006 Document Revised: 02/11/2013 Document Reviewed: 01/08/2013 ExitCare Patient Information 2015 [employer]. This information is not intended to replace advice given to you by your health care provider. Make sure you discuss any questions you have with your health care provider.  

## 2014-10-14 NOTE — MAU Note (Signed)
Pt states she has been having uc's since 0830 this morning, denies bleeding or LOF.

## 2014-10-15 ENCOUNTER — Inpatient Hospital Stay (HOSPITAL_COMMUNITY): Payer: Medicaid Other | Admitting: Anesthesiology

## 2014-10-15 ENCOUNTER — Encounter (HOSPITAL_COMMUNITY): Payer: Self-pay

## 2014-10-15 ENCOUNTER — Inpatient Hospital Stay (HOSPITAL_COMMUNITY)
Admission: RE | Admit: 2014-10-15 | Discharge: 2014-10-17 | DRG: 775 | Disposition: A | Discharge status: Home / Self Care | Payer: Medicaid Other | Source: Ambulatory Visit | Attending: Obstetrics | Admitting: Obstetrics

## 2014-10-15 DIAGNOSIS — O9952 Diseases of the respiratory system complicating childbirth: Secondary | ICD-10-CM | POA: Diagnosis present

## 2014-10-15 DIAGNOSIS — J45909 Unspecified asthma, uncomplicated: Secondary | ICD-10-CM | POA: Diagnosis present

## 2014-10-15 DIAGNOSIS — G4730 Sleep apnea, unspecified: Secondary | ICD-10-CM | POA: Diagnosis present

## 2014-10-15 DIAGNOSIS — O9989 Other specified diseases and conditions complicating pregnancy, childbirth and the puerperium: Secondary | ICD-10-CM | POA: Diagnosis present

## 2014-10-15 LAB — OB RESULTS CONSOLE GBS: GBS: NEGATIVE

## 2014-10-15 LAB — CBC
HEMATOCRIT: 28.5 % — AB (ref 36.0–46.0)
HEMOGLOBIN: 9.2 g/dL — AB (ref 12.0–15.0)
MCH: 28 pg (ref 26.0–34.0)
MCHC: 32.3 g/dL (ref 30.0–36.0)
MCV: 86.9 fL (ref 78.0–100.0)
Platelets: 145 10*3/uL — ABNORMAL LOW (ref 150–400)
RBC: 3.28 MIL/uL — ABNORMAL LOW (ref 3.87–5.11)
RDW: 14.4 % (ref 11.5–15.5)
WBC: 5.7 10*3/uL (ref 4.0–10.5)

## 2014-10-15 LAB — TYPE AND SCREEN
ABO/RH(D): O NEG
Antibody Screen: NEGATIVE

## 2014-10-15 MED ORDER — SENNOSIDES-DOCUSATE SODIUM 8.6-50 MG PO TABS
2.0000 | ORAL_TABLET | ORAL | Status: DC
  Administered 2014-10-15 – 2014-10-16 (×2): 2 via ORAL
  Filled 2014-10-15 (×2): qty 2

## 2014-10-15 MED ORDER — BUTORPHANOL TARTRATE 1 MG/ML IJ SOLN: 1.0000 mg | INTRAMUSCULAR | Status: DC | PRN

## 2014-10-15 MED ORDER — PHENYLEPHRINE 40 MCG/ML (10ML) SYRINGE FOR IV PUSH (FOR BLOOD PRESSURE SUPPORT): 80.0000 ug | PREFILLED_SYRINGE | INTRAVENOUS | Status: DC | PRN

## 2014-10-15 MED ORDER — BENZOCAINE-MENTHOL 20-0.5 % EX AERO
1.0000 "application " | INHALATION_SPRAY | CUTANEOUS | Status: DC | PRN
  Administered 2014-10-15: 1 via TOPICAL
  Filled 2014-10-15: qty 56

## 2014-10-15 MED ORDER — OXYTOCIN 40 UNITS IN LACTATED RINGERS INFUSION - SIMPLE MED
62.5000 mL/h | INTRAVENOUS | Status: DC
  Administered 2014-10-15: 999 mL/h via INTRAVENOUS

## 2014-10-15 MED ORDER — FERROUS SULFATE 325 (65 FE) MG PO TABS
325.0000 mg | ORAL_TABLET | Freq: Two times a day (BID) | ORAL | Status: DC
  Administered 2014-10-16 – 2014-10-17 (×3): 325 mg via ORAL
  Filled 2014-10-15 (×3): qty 1

## 2014-10-15 MED ORDER — FENTANYL 2.5 MCG/ML BUPIVACAINE 1/10 % EPIDURAL INFUSION (WH - ANES)
INTRAMUSCULAR | Status: AC
  Filled 2014-10-15: qty 125

## 2014-10-15 MED ORDER — LIDOCAINE HCL (PF) 1 % IJ SOLN
30.0000 mL | INTRAMUSCULAR | Status: DC | PRN
  Filled 2014-10-15: qty 30

## 2014-10-15 MED ORDER — ONDANSETRON HCL 4 MG PO TABS: 4.0000 mg | ORAL_TABLET | ORAL | Status: DC | PRN

## 2014-10-15 MED ORDER — OXYCODONE-ACETAMINOPHEN 5-325 MG PO TABS
1.0000 | ORAL_TABLET | ORAL | Status: DC | PRN
  Administered 2014-10-16 – 2014-10-17 (×3): 1 via ORAL
  Filled 2014-10-15 (×3): qty 1

## 2014-10-15 MED ORDER — LIDOCAINE HCL (PF) 1 % IJ SOLN
INTRAMUSCULAR | Status: DC | PRN
  Administered 2014-10-15 (×2): 4 mL

## 2014-10-15 MED ORDER — LACTATED RINGERS IV SOLN
INTRAVENOUS | Status: DC
  Administered 2014-10-15 (×2): via INTRAVENOUS

## 2014-10-15 MED ORDER — DIPHENHYDRAMINE HCL 25 MG PO CAPS: 25.0000 mg | ORAL_CAPSULE | Freq: Four times a day (QID) | ORAL | Status: DC | PRN

## 2014-10-15 MED ORDER — ZOLPIDEM TARTRATE 5 MG PO TABS: 5.0000 mg | ORAL_TABLET | Freq: Every evening | ORAL | Status: DC | PRN

## 2014-10-15 MED ORDER — FLEET ENEMA 7-19 GM/118ML RE ENEM: 1.0000 | ENEMA | RECTAL | Status: DC | PRN

## 2014-10-15 MED ORDER — ONDANSETRON HCL 4 MG/2ML IJ SOLN: 4.0000 mg | Freq: Four times a day (QID) | INTRAMUSCULAR | Status: DC | PRN

## 2014-10-15 MED ORDER — WITCH HAZEL-GLYCERIN EX PADS: 1.0000 "application " | MEDICATED_PAD | CUTANEOUS | Status: DC | PRN

## 2014-10-15 MED ORDER — SIMETHICONE 80 MG PO CHEW: 80.0000 mg | CHEWABLE_TABLET | ORAL | Status: DC | PRN

## 2014-10-15 MED ORDER — ONDANSETRON HCL 4 MG/2ML IJ SOLN: 4.0000 mg | INTRAMUSCULAR | Status: DC | PRN

## 2014-10-15 MED ORDER — ACETAMINOPHEN 325 MG PO TABS: 650.0000 mg | ORAL_TABLET | ORAL | Status: DC | PRN

## 2014-10-15 MED ORDER — OXYTOCIN BOLUS FROM INFUSION: 500.0000 mL | INTRAVENOUS | Status: DC

## 2014-10-15 MED ORDER — TERBUTALINE SULFATE 1 MG/ML IJ SOLN: 0.2500 mg | Freq: Once | INTRAMUSCULAR | Status: DC | PRN

## 2014-10-15 MED ORDER — LANOLIN HYDROUS EX OINT
TOPICAL_OINTMENT | CUTANEOUS | Status: DC | PRN
Start: 2014-10-15 — End: 2014-10-17

## 2014-10-15 MED ORDER — OXYTOCIN 40 UNITS IN LACTATED RINGERS INFUSION - SIMPLE MED
1.0000 m[IU]/min | INTRAVENOUS | Status: DC
  Administered 2014-10-15: 8 m[IU]/min via INTRAVENOUS
  Administered 2014-10-15: 6 m[IU]/min via INTRAVENOUS
  Administered 2014-10-15: 4 m[IU]/min via INTRAVENOUS
  Administered 2014-10-15: 12 m[IU]/min via INTRAVENOUS
  Administered 2014-10-15: 2 m[IU]/min via INTRAVENOUS
  Administered 2014-10-15: 10 m[IU]/min via INTRAVENOUS
  Filled 2014-10-15: qty 1000

## 2014-10-15 MED ORDER — LACTATED RINGERS IV SOLN
500.0000 mL | Freq: Once | INTRAVENOUS | Status: AC
  Administered 2014-10-15: 13:00:00 via INTRAVENOUS

## 2014-10-15 MED ORDER — TETANUS-DIPHTH-ACELL PERTUSSIS 5-2.5-18.5 LF-MCG/0.5 IM SUSP: 0.5000 mL | Freq: Once | INTRAMUSCULAR | Status: DC

## 2014-10-15 MED ORDER — DIBUCAINE 1 % RE OINT: 1.0000 "application " | TOPICAL_OINTMENT | RECTAL | Status: DC | PRN

## 2014-10-15 MED ORDER — IBUPROFEN 600 MG PO TABS
600.0000 mg | ORAL_TABLET | Freq: Four times a day (QID) | ORAL | Status: DC
  Administered 2014-10-15 – 2014-10-17 (×6): 600 mg via ORAL
  Filled 2014-10-15 (×6): qty 1

## 2014-10-15 MED ORDER — LACTATED RINGERS IV SOLN
500.0000 mL | INTRAVENOUS | Status: DC | PRN
  Administered 2014-10-15: 450 mL via INTRAVENOUS

## 2014-10-15 MED ORDER — FENTANYL 2.5 MCG/ML BUPIVACAINE 1/10 % EPIDURAL INFUSION (WH - ANES)
14.0000 mL/h | INTRAMUSCULAR | Status: DC | PRN
  Administered 2014-10-15: 14 mL/h via EPIDURAL

## 2014-10-15 MED ORDER — EPHEDRINE 5 MG/ML INJ: 10.0000 mg | INTRAVENOUS | Status: DC | PRN

## 2014-10-15 MED ORDER — PHENYLEPHRINE 40 MCG/ML (10ML) SYRINGE FOR IV PUSH (FOR BLOOD PRESSURE SUPPORT)
PREFILLED_SYRINGE | INTRAVENOUS | Status: AC
  Filled 2014-10-15: qty 20

## 2014-10-15 MED ORDER — PNEUMOCOCCAL VAC POLYVALENT 25 MCG/0.5ML IJ INJ
0.5000 mL | INJECTION | INTRAMUSCULAR | Status: AC
  Administered 2014-10-16: 0.5 mL via INTRAMUSCULAR
  Filled 2014-10-15: qty 0.5

## 2014-10-15 MED ORDER — OXYCODONE-ACETAMINOPHEN 5-325 MG PO TABS: 2.0000 | ORAL_TABLET | ORAL | Status: DC | PRN

## 2014-10-15 MED ORDER — PRENATAL MULTIVITAMIN CH
1.0000 | ORAL_TABLET | Freq: Every day | ORAL | Status: DC
  Administered 2014-10-16: 1 via ORAL
  Filled 2014-10-15: qty 1

## 2014-10-15 MED ORDER — DIPHENHYDRAMINE HCL 50 MG/ML IJ SOLN: 12.5000 mg | INTRAMUSCULAR | Status: DC | PRN

## 2014-10-15 MED ORDER — CITRIC ACID-SODIUM CITRATE 334-500 MG/5ML PO SOLN: 30.0000 mL | ORAL | Status: DC | PRN

## 2014-10-15 NOTE — H&P (Signed)
This is Dr. Francoise CeoBernard Tre Sanker dictating the history and physicalon Hannah Rodriguez  She is a 34 year old gravida 9 para 539 weeks and 5 days desiring induction negative GBS cervix 2 cm 70% vertex -3 not performed when she is on low-dose Pitocin with irregular contractions Past medical history was negative Past surgical history negative Social history denies smoking or alcohol or drug use System review was negative Physical exam well-developed female unable HEENT negative Lungs clear. Rhythm no murmurs gallops  Surgical history Abdomen term (Described above

## 2014-10-15 NOTE — Anesthesia Procedure Notes (Signed)
Epidural Patient location during procedure: OB Start time: 10/15/2014 2:00 PM  Staffing Anesthesiologist: Karie SchwalbeJUDD, Miklos Bidinger Performed by: anesthesiologist   Preanesthetic Checklist Completed: patient identified, site marked, surgical consent, pre-op evaluation, timeout performed, IV checked, risks and benefits discussed and monitors and equipment checked  Epidural Patient position: sitting Prep: site prepped and draped and DuraPrep Patient monitoring: continuous pulse ox and blood pressure Approach: midline Location: L3-L4 Injection technique: LOR saline  Needle:  Needle type: Tuohy  Needle gauge: 17 G Needle length: 9 cm and 9 Needle insertion depth: 5 cm cm Catheter type: closed end flexible Catheter size: 19 Gauge Catheter at skin depth: 10 cm Test dose: negative  Assessment Events: blood not aspirated, injection not painful, no injection resistance, negative IV test and no paresthesia  Additional Notes Patient identified. Risks/Benefits/Options discussed with patient including but not limited to bleeding, infection, nerve damage, paralysis, failed block, incomplete pain control, headache, blood pressure changes, nausea, vomiting, reactions to medication both or allergic, itching and postpartum back pain. Confirmed with bedside nurse the patient's most recent platelet count. Confirmed with patient that they are not currently taking any anticoagulation, have any bleeding history or any family history of bleeding disorders. Patient expressed understanding and wished to proceed. All questions were answered. Sterile technique was used throughout the entire procedure. Please see nursing notes for vital signs. Test dose was given through epidural catheter and negative prior to continuing to dose epidural or start infusion. Warning signs of high block given to the patient including shortness of breath, tingling/numbness in hands, complete motor block, or any concerning symptoms with instructions  to call for help. Patient was given instructions on fall risk and not to get out of bed. All questions and concerns addressed with instructions to call with any issues or inadequate analgesia.

## 2014-10-15 NOTE — Anesthesia Preprocedure Evaluation (Signed)
Anesthesia Evaluation  Patient identified by MRN, date of birth, ID band Patient awake    Reviewed: Allergy & Precautions, NPO status , Patient's Chart, lab work & pertinent test results  History of Anesthesia Complications Negative for: history of anesthetic complications  Airway Mallampati: III  TM Distance: >3 FB Neck ROM: Full    Dental no notable dental hx. (+) Dental Advisory Given   Pulmonary asthma , sleep apnea ,  breath sounds clear to auscultation  Pulmonary exam normal       Cardiovascular negative cardio ROS  Rhythm:Regular Rate:Normal     Neuro/Psych negative neurological ROS  negative psych ROS   GI/Hepatic negative GI ROS, Neg liver ROS,   Endo/Other  Morbid obesityobesity  Renal/GU negative Renal ROS  negative genitourinary   Musculoskeletal negative musculoskeletal ROS (+)   Abdominal   Peds negative pediatric ROS (+)  Hematology negative hematology ROS (+)   Anesthesia Other Findings   Reproductive/Obstetrics (+) Pregnancy                             Anesthesia Physical Anesthesia Plan  ASA: III  Anesthesia Plan: Epidural   Post-op Pain Management:    Induction:   Airway Management Planned:   Additional Equipment:   Intra-op Plan:   Post-operative Plan:   Informed Consent: I have reviewed the patients History and Physical, chart, labs and discussed the procedure including the risks, benefits and alternatives for the proposed anesthesia with the patient or authorized representative who has indicated his/her understanding and acceptance.   Dental advisory given  Plan Discussed with: CRNA  Anesthesia Plan Comments:         Anesthesia Quick Evaluation

## 2014-10-16 LAB — CBC
HCT: 27.1 % — ABNORMAL LOW (ref 36.0–46.0)
Hemoglobin: 8.8 g/dL — ABNORMAL LOW (ref 12.0–15.0)
MCH: 28 pg (ref 26.0–34.0)
MCHC: 32.5 g/dL (ref 30.0–36.0)
MCV: 86.3 fL (ref 78.0–100.0)
PLATELETS: 126 10*3/uL — AB (ref 150–400)
RBC: 3.14 MIL/uL — ABNORMAL LOW (ref 3.87–5.11)
RDW: 14.6 % (ref 11.5–15.5)
WBC: 6.7 10*3/uL (ref 4.0–10.5)

## 2014-10-16 LAB — RPR: RPR Ser Ql: NONREACTIVE

## 2014-10-16 MED ORDER — RHO D IMMUNE GLOBULIN 1500 UNIT/2ML IJ SOSY
300.0000 ug | PREFILLED_SYRINGE | Freq: Once | INTRAMUSCULAR | Status: AC
  Administered 2014-10-16: 300 ug via INTRAVENOUS
  Filled 2014-10-16: qty 2

## 2014-10-16 NOTE — Progress Notes (Signed)
Patient ID: Hannah Rodriguez, female   DOB: Feb 02, 1981, 34 y.o.   MRN: [id number] Postpartum day one Vital signs normal Fundus firm Lochia moderate Doing well

## 2014-10-16 NOTE — Lactation Note (Addendum)
This note was copied from the chart of Hannah Haskel SchroederBetty Austria. Lactation Consultation Note  Patient Name: Hannah Rodriguez [id number]'s Date: 10/16/2014 Reason for consult: Initial assessment Baby 23 hours of life. Mom reports baby sleepy at first, but now baby latching a nursing well. Mom states that she has lots of experience BF. Enc mom to nurse with cues and call for assistance as needed. Mom states that she always has more EBM than she can use. Mom given [facility] broch[place], aware of OP/BFSG, community resources, and [facility] phone line assistance after D/C.  Maternal Data Has patient been taught Hand Expression?: Yes (Per mom.)  Feeding    LATCH Score/Interventions Latch: Grasps breast easily, tongue down, lips flanged, rhythmical sucking.  Audible Swallowing: Spontaneous and intermittent  Type of Nipple: Everted at rest and after stimulation  Comfort (Breast/Nipple): Soft / non-tender     Hold (Positioning): Assistance needed to correctly position infant at breast and maintain latch.  LATCH Score: 9  Lactation Tools Discussed/Used     Consult Status Consult Status: PRN    Geralynn OchsWILLIARD, [name] 10/16/2014, 6:25 PM

## 2014-10-16 NOTE — Anesthesia Postprocedure Evaluation (Signed)
  Anesthesia Post-op Note  Patient: Hannah Rodriguez  Procedure(s) Performed: * No procedures listed *  Patient Location: Mother/Baby  Anesthesia Type:Epidural  Level of Consciousness: awake and alert   Airway and Oxygen Therapy: Patient Spontanous Breathing  Post-op Pain: mild  Post-op Assessment: Post-op Vital signs reviewed, Patient's Cardiovascular Status Stable, Respiratory Function Stable, No signs of Nausea or vomiting, Pain level controlled, No headache, No residual numbness and No residual motor weakness  Post-op Vital Signs: Reviewed  Last Vitals:  Filed Vitals:   10/16/14 0602  BP: 124/64  Pulse: 78  Temp: 36.7 C  Resp: 19    Complications: No apparent anesthesia complications

## 2014-10-16 NOTE — Anesthesia Postprocedure Evaluation (Signed)
Anesthesia Post Note  Patient: Hannah Rodriguez  Procedure(s) Performed: * No procedures listed *  Anesthesia type: Epidural  Patient location: Womens unit  Post pain: Pain level controlled  Post assessment: Post-op Vital signs reviewed  Last Vitals:  Filed Vitals:   10/16/14 0602  BP: 124/64  Pulse: 78  Temp: 36.7 C  Resp: 19    Post vital signs: Reviewed  Level of consciousness:alert  Complications: No apparent anesthesia complications

## 2014-10-17 LAB — RH IG WORKUP (INCLUDES ABO/RH)
ABO/RH(D): O NEG
Fetal Screen: NEGATIVE
Gestational Age(Wks): 39
Unit division: 0

## 2014-10-17 MED ORDER — MEDROXYPROGESTERONE ACETATE 150 MG/ML IM SUSP
150.0000 mg | Freq: Once | INTRAMUSCULAR | Status: AC
  Administered 2014-10-17: 150 mg via INTRAMUSCULAR
  Filled 2014-10-17: qty 1

## 2014-10-17 NOTE — Progress Notes (Signed)
Patient ID: Hannah Rodriguez, female   DOB: 08-08-1980, 34 y.o.   MRN: [id number] Postpartum day 2 Blood pressure 146/79 respirations 19 pulse 100 Fundus firm lochia moderate legs negative doing well home today

## 2014-10-17 NOTE — Discharge Summary (Signed)
Obstetric Discharge Summary Reason for Admission: induction of labor Prenatal Procedures: none Intrapartum Procedures: spontaneous vaginal delivery Postpartum Procedures: none Complications-Operative and Postpartum: none HEMOGLOBIN  Date Value Ref Range Status  10/16/2014 8.8* 12.0 - 15.0 g/dL Final   HCT  Date Value Ref Range Status  10/16/2014 27.1* 36.0 - 46.0 % Final    Physical Exam:  General: alert Lochia: appropriate Uterine Fundus: firm Incision: healing well DVT Evaluation: No evidence of DVT seen on physical exam.  Discharge Diagnoses: Term Pregnancy-delivered  Discharge Information: Date: 10/17/2014 Activity: pelvic rest Diet: routine Medications: Percocet Condition: improved Instructions: refer to practice specific booklet Discharge to: home Follow-up Information    Follow up with Kathreen CosierMARSHALL,Madeliene Tejera A, MD.   Specialty:  Obstetrics and Gynecology   Contact information:   [address] [place] [id number] [phone]       Newborn Data: Live born female  Birth Weight: 7 lb 8.6 oz (3419 g) APGAR: 9, 9  Home with mother.  Hannah Rodriguez A 10/17/2014, 5:58 AM

## 2014-10-17 NOTE — Discharge Instructions (Signed)
Discharge instructions   You can wash your hair  Shower  Eat what you want  Drink what you want  See me in 6 weeks  Your ankles are going to swell more in the next 2 weeks than when pregnant  No sex for 6 weeks   Aleene Swanner A, MD 10/17/2014

## 2014-10-19 NOTE — Progress Notes (Signed)
Post discharge chart review completed.  

## 2015-11-22 ENCOUNTER — Encounter (HOSPITAL_COMMUNITY): Payer: Self-pay | Admitting: *Deleted

## 2015-11-22 ENCOUNTER — Emergency Department (HOSPITAL_COMMUNITY)
Admission: EM | Admit: 2015-11-22 | Discharge: 2015-11-23 | Disposition: A | Discharge status: Home / Self Care | Payer: Medicaid Other | Attending: Emergency Medicine | Admitting: Emergency Medicine

## 2015-11-22 ENCOUNTER — Emergency Department (HOSPITAL_COMMUNITY): Payer: Medicaid Other

## 2015-11-22 DIAGNOSIS — Y999 Unspecified external cause status: Secondary | ICD-10-CM | POA: Insufficient documentation

## 2015-11-22 DIAGNOSIS — S93402A Sprain of unspecified ligament of left ankle, initial encounter: Secondary | ICD-10-CM

## 2015-11-22 DIAGNOSIS — W228XXA Striking against or struck by other objects, initial encounter: Secondary | ICD-10-CM | POA: Insufficient documentation

## 2015-11-22 DIAGNOSIS — Z79899 Other long term (current) drug therapy: Secondary | ICD-10-CM | POA: Diagnosis not present

## 2015-11-22 DIAGNOSIS — Z91040 Latex allergy status: Secondary | ICD-10-CM | POA: Diagnosis not present

## 2015-11-22 DIAGNOSIS — F17210 Nicotine dependence, cigarettes, uncomplicated: Secondary | ICD-10-CM | POA: Diagnosis not present

## 2015-11-22 DIAGNOSIS — Y92096 Garden or yard of other non-institutional residence as the place of occurrence of the external cause: Secondary | ICD-10-CM | POA: Diagnosis not present

## 2015-11-22 DIAGNOSIS — S99912A Unspecified injury of left ankle, initial encounter: Secondary | ICD-10-CM | POA: Diagnosis present

## 2015-11-22 DIAGNOSIS — J45909 Unspecified asthma, uncomplicated: Secondary | ICD-10-CM | POA: Diagnosis not present

## 2015-11-22 DIAGNOSIS — Y939 Activity, unspecified: Secondary | ICD-10-CM | POA: Insufficient documentation

## 2015-11-22 DIAGNOSIS — F329 Major depressive disorder, single episode, unspecified: Secondary | ICD-10-CM | POA: Insufficient documentation

## 2015-11-22 HISTORY — DX: Anxiety disorder, unspecified: F41.9

## 2015-11-22 HISTORY — DX: Major depressive disorder, single episode, unspecified: F32.9

## 2015-11-22 HISTORY — DX: Depression, unspecified: F32.A

## 2015-11-22 LAB — POC URINE PREG, ED: PREG TEST UR: NEGATIVE

## 2015-11-22 MED ORDER — NAPROXEN 250 MG PO TABS: 250.0000 mg | ORAL_TABLET | Freq: Two times a day (BID) | ORAL | Status: DC

## 2015-11-22 MED ORDER — HYDROCODONE-ACETAMINOPHEN 5-325 MG PO TABS
1.0000 | ORAL_TABLET | Freq: Once | ORAL | Status: AC
  Administered 2015-11-22: 1 via ORAL
  Filled 2015-11-22: qty 1

## 2015-11-22 NOTE — ED Notes (Signed)
Pt states that she tripped and rolled her left ankle; pt c/o pain to left ankle; pt states "I heard a crack when I fell"

## 2015-11-22 NOTE — ED Provider Notes (Signed)
CSN: [id number]     Arrival date & time 11/22/15  2153 History  By signing my name below, I, Hannah Rodriguez, attest that this documentation has been prepared under the direction and in the presence of Will Braden Cimo, PA-C Electronically Signed: Soijett Rodriguez, ED Scribe. 11/22/2015. 11:40 PM.   Chief Complaint  Patient presents with  . Ankle Pain      The history is provided by the patient. No language interpreter was used.    Hannah Rodriguez is a 35 y.o. female who presents to the Emergency Department complaining of left ankle pain onset today PTA. Pt notes that she tripped and rolled her left ankle over wood in her yard.  Pt is having associated symptoms of left ankle swelling and gait problem due to pain. She notes that she has tried ice with no relief of her symptoms. She denies color change, wound, rash, numbness, tingling, and any other symptoms.     Past Medical History  Diagnosis Date  . Asthma   . Anxiety   . Depression    Past Surgical History  Procedure Laterality Date  . No past surgeries    . Induced abortion     Family History  Problem Relation Age of Onset  . Heart disease Mother   . Cancer Maternal Uncle   . Heart disease Maternal Grandmother    Social History  Substance Use Topics  . Smoking status: Current Every Day Smoker -- 1.00 packs/day    Types: Cigarettes  . Smokeless tobacco: Never Used  . Alcohol Use: Yes     Comment: socially beer   OB History    Gravida Para Term Preterm AB TAB SAB Ectopic Multiple Living   0 6      Obstetric Comments   Termination is confidential;     Review of Systems  Constitutional: Negative for fever.  Musculoskeletal: Positive for joint swelling (left ankle), arthralgias (left ankle) and gait problem (due to pain).  Skin: Negative for color change, rash and wound.  Neurological: Negative for weakness and numbness.       No tingling      Allergies  Codeine and Latex  Home Medications   Prior to  Admission medications   Medication Sig Start Date End Date Taking? Authorizing Provider  naproxen (NAPROSYN) 250 MG tablet Take 1 tablet (250 mg total) by mouth 2 (two) times daily with a meal. 11/22/15   Everlene Farrier, PA-C   BP 127/78 mmHg  Pulse 65  Temp(Src) 98.3 F (36.8 C) (Oral)  Resp 18  SpO2 100%  LMP 10/15/2015 Physical Exam  Constitutional: She appears well-developed and well-nourished. No distress.  Non-toxic appearing.   HENT:  Head: Normocephalic and atraumatic.  Eyes: Right eye exhibits no discharge. Left eye exhibits no discharge.  Cardiovascular: Normal rate, regular rhythm and intact distal pulses.   Bilateral dorsalis pedis and posterior tibialis pulses are intact. Good capillary refill to her distal toes.  Pulmonary/Chest: Effort normal. No respiratory distress.  Musculoskeletal: Normal range of motion. She exhibits edema and tenderness.  Left lateral ankle edema. No ecchymosis. No deformity. No erythema. No calf edema. Bilateral dp and pt pulses intact. Good cap refill. No ankle instability.   Neurological: She is alert. Coordination normal.  Skin: Skin is warm and dry. No rash noted. She is not diaphoretic. No erythema. No pallor.  Psychiatric: She has a normal mood and affect. Her behavior is normal.  Nursing note and  vitals reviewed.   ED Course  Procedures (including critical care time) DIAGNOSTIC STUDIES: Oxygen Saturation is 100% on RA, nl by my interpretation.    COORDINATION OF CARE: 11:39 PM Discussed treatment plan with pt at bedside which includes left ankle xray, UA, ASO splint, crutches, referral to orthopedist, and pt agreed to plan.    Labs Review Labs Reviewed  POC URINE PREG, ED    Imaging Review Dg Ankle Complete Left  11/22/2015  CLINICAL DATA:  Fall, lateral ankle pain/swelling EXAM: LEFT ANKLE COMPLETE - 3+ VIEW COMPARISON:  06/08/2013 FINDINGS: No fracture or dislocation is seen. The ankle mortise is intact. The base of the fifth  metatarsal is unremarkable. This tiny plantar calcaneal enthesophyte. Mild lateral soft tissue swelling. IMPRESSION: No fracture or dislocation is seen. Mild lateral soft tissue swelling. Electronically Signed   By: Charline BillsSriyesh  Krishnan M.D.   On: 11/22/2015 23:12   I have personally reviewed and evaluated these images and lab results as part of my medical decision-making.   EKG Interpretation None      Filed Vitals:   11/22/15 2233 11/22/15 2355  BP: 141/88 127/78  Pulse: 63 65  Temp: 98.3 F (36.8 C)   TempSrc: Oral   Resp: 12 18  SpO2: 100% 100%     MDM   Meds given in ED:  Medications  HYDROcodone-acetaminophen (NORCO/VICODIN) 5-325 MG per tablet 1 tablet (1 tablet Oral Given 11/22/15 2347)    Discharge Medication List as of 11/22/2015 11:43 PM    START taking these medications   Details  naproxen (NAPROSYN) 250 MG tablet Take 1 tablet (250 mg total) by mouth 2 (two) times daily with a meal., Starting 11/22/2015, Until Discontinued, Print        Final diagnoses:  Left ankle sprain, initial encounter   Patient with a left ankle sprain. She is neurovascularly intact. X-ray shows no fracture or dislocation. There is mild lateral soft tissue swelling. Will place an ASO ankle splint and provided with crutches and have her follow-up with sports medicine orthopedic surgery. We'll discharge with prescription for naproxen. I discussed return precautions. I advised the patient to follow-up with their primary care provider this week. I advised the patient to return to the emergency department with new or worsening symptoms or new concerns. The patient verbalized understanding and agreement with plan.    I personally performed the services described in this documentation, which was scribed in my presence. The recorded information has been reviewed and is accurate.       Everlene FarrierWilliam Nydia Ytuarte, PA-C 11/23/15 [id number]  Leta BaptistEmily Roe Nguyen, MD 11/26/15 [phone]

## 2015-11-22 NOTE — Discharge Instructions (Signed)
Ankle Sprain °An ankle sprain is an injury to the strong, fibrous tissues (ligaments) that hold the bones of your ankle joint together.  °CAUSES °An ankle sprain is usually caused by a fall or by twisting your ankle. Ankle sprains most commonly occur when you step on the outer edge of your foot, and your ankle turns inward. People who participate in sports are more prone to these types of injuries.  °SYMPTOMS  °· Pain in your ankle. The pain may be present at rest or only when you are trying to stand or walk. °· Swelling. °· Bruising. Bruising may develop immediately or within 1 to 2 days after your injury. °· Difficulty standing or walking, particularly when turning corners or changing directions. °DIAGNOSIS  °Your caregiver will ask you details about your injury and perform a physical exam of your ankle to determine if you have an ankle sprain. During the physical exam, your caregiver will press on and apply pressure to specific areas of your foot and ankle. Your caregiver will try to move your ankle in certain ways. An X-ray exam may be done to be sure a bone was not broken or a ligament did not separate from one of the bones in your ankle (avulsion fracture).  °TREATMENT  °Certain types of braces can help stabilize your ankle. Your caregiver can make a recommendation for this. Your caregiver may recommend the use of medicine for pain. If your sprain is severe, your caregiver may refer you to a surgeon who helps to restore function to parts of your skeletal system (orthopedist) or a physical therapist. °HOME CARE INSTRUCTIONS  °1. Apply ice to your injury for 1-2 days or as directed by your caregiver. Applying ice helps to reduce inflammation and pain. °1. Put ice in a plastic bag. °2. Place a towel between your skin and the bag. °3. Leave the ice on for 15-20 minutes at a time, every 2 hours while you are awake. °2. Only take over-the-counter or prescription medicines for pain, discomfort, or fever as directed  by your caregiver. °3. Elevate your injured ankle above the level of your heart as much as possible for 2-3 days. °4. If your caregiver recommends crutches, use them as instructed. Gradually put weight on the affected ankle. Continue to use crutches or a cane until you can walk without feeling pain in your ankle. °5. If you have a plaster splint, wear the splint as directed by your caregiver. Do not rest it on anything harder than a pillow for the first 24 hours. Do not put weight on it. Do not get it wet. You may take it off to take a shower or bath. °6. You may have been given an elastic bandage to wear around your ankle to provide support. If the elastic bandage is too tight (you have numbness or tingling in your foot or your foot becomes cold and blue), adjust the bandage to make it comfortable. °7. If you have an air splint, you may blow more air into it or let air out to make it more comfortable. You may take your splint off at night and before taking a shower or bath. Wiggle your toes in the splint several times per day to decrease swelling. °SEEK MEDICAL CARE IF:  °1. You have rapidly increasing bruising or swelling. °2. Your toes feel extremely cold or you lose feeling in your foot. °3. Your pain is not relieved with medicine. °SEEK IMMEDIATE MEDICAL CARE IF: °1. Your toes are numb or blue. °  2. You have severe pain that is increasing. °MAKE SURE YOU:  °1. Understand these instructions. °2. Will watch your condition. °3. Will get help right away if you are not doing well or get worse. °  °This information is not intended to replace advice given to you by your health care provider. Make sure you discuss any questions you have with your health care provider. °  °Document Released: 06/11/2005 Document Revised: 07/02/2014 Document Reviewed: 06/23/2011 °Elsevier Interactive Patient Education ©2016 Elsevier Inc. °Stirrup Ankle Brace °Stirrup ankle braces give support and help stabilize the ankle joint. They are  rigid pieces of plastic or fiberglass that go up both sides of the lower leg with the bottom of the stirrup fitting comfortably under the bottom of the instep of the foot. It can be held on with Velcro straps or an elastic wrap. Stirrup ankle braces are used to support the ankle following mild or moderate sprains or strains, or fractures after cast removal.  °They can be easily removed or adjusted if there is swelling. The rigid brace shells are designed to fit the ankle comfortably and provide the needed medial/lateral stabilization. This brace can be easily worn with most athletic shoes. The brace liner is usually made of a soft, comfortable gel-like material. This gel fits the ankle well without causing uncomfortable pressure points.  °IMPORTANCE OF ANKLE BRACES: °· The use of ankle bracing is effective in the prevention of ankle sprains. °· In athletes, the use of ankle bracing will offer protection and prevent further sprains. °· Research shows that a complete rehabilitation program needs to be included with external bracing. This includes range of motion and ankle strengthening exercises. Your caregivers will instruct you in this. °If you were given the brace today for a new injury, use the following home care instructions as a guide. °HOME CARE INSTRUCTIONS  °8. Apply ice to the sore area for 15-20 minutes, 03-04 times per day while awake for the first 2 days. Put the ice in a plastic bag and place a towel between the bag of ice and your skin. Never place the ice pack directly on your skin. Be especially careful using ice on an elbow or knee or other bony area, such as your ankle, because icing for too long may damage the nerves which are close to the surface. °9. Keep your leg elevated when possible to lessen swelling. °10. Wear your splint until you are seen for a follow-up examination. Do not put weight on it. Do not get it wet. You may take it off to take a shower or bath. °11. For Activity: Use crutches  with non-weight bearing for 1 week. Then, you may walk on your ankle as instructed. Start gradually with weight bearing on the affected ankle. °12. Continue to use crutches or a cane until you can stand on your ankle without causing pain. °13. Wiggle your toes in the splint several times per day if you are able. °14. The splint is too tight if you have numbness, tingling, or if your foot becomes cold and blue. Adjust the straps or elastic bandage to make it comfortable. °15. Only take over-the-counter or prescription medicines for pain, discomfort, or fever as directed by your caregiver. °SEEK IMMEDIATE MEDICAL CARE IF:  °4. You have increased bruising, swelling or pain. °5. Your toes are blue or cold and loosening the brace or wrap does not help. °6. Your pain is not relieved with medicine. °MAKE SURE YOU:  °3. Understand these instructions. °4.   Will watch your condition. 5. Will get help right away if you are not doing well or get worse.   This information is not intended to replace advice given to you by your health care provider. Make sure you discuss any questions you have with your health care provider.   Document Released: 04/11/2004 Document Revised: 09/03/2011 Document Reviewed: 01/11/2015 Elsevier Interactive Patient Education 2016 [employer] Crutch Use Crutches are used to take weight off one of your legs or feet when you stand or walk. It is important to use crutches that fit properly. When fitted properly:  Each crutch should be 2-3 finger widths below the armpit.  Your weight should be supported by your hand, and not by resting the armpit on the crutch. RISKS AND COMPLICATIONS Damage to the nerves that extend from your armpit to your hand and arm. To prevent this from happening, make sure your crutches fit properly and do not put pressure on your armpit when using them. HOW TO USE YOUR CRUTCHES If you have been instructed to use partial weight bearing, apply (bear) the amount of  weight as your health care provider suggests. Do not bear weight in an amount that causes pain to the area of injury. Walking 16. Step with the crutches. 17. Swing the healthy leg slightly ahead of the crutches. Going Up Steps If there is no handrail: 7. Step up with the healthy leg. 8. Step up with the crutches and injured leg. 9. Continue in this way. If there is a handrail: 6. Hold both crutches in one hand. 7. Place your free hand on the handrail. 8. While putting your weight on your arms, lift your healthy leg to the step. 9. Bring the crutches and the injured leg up to that step. 10. Continue in this way. Going Down Steps Be very careful, as going down stairs with crutches is very challenging. If there is no handrail: 4. Step down with the injured leg and crutches. 5. Step down with the healthy leg. If there is a handrail: 1. Place your hand on the handrail. 2. Hold both crutches with your free hand. 3. Lower your injured leg and crutch to the step below you. Make sure to keep the crutch tips in the center of the step, never on the edge. 4. Lower your healthy leg to that step. 5. Continue in this way. Standing Up 1. Hold the injured leg forward. 2. Grab the armrest with one hand and the top of the crutches with the other hand. 3. Using these supports, pull yourself up to a standing position. Sitting Down 1. Hold the injured leg forward. 2. Grab the armrest with one hand and the top of the crutches with the other hand. 3. Lower yourself to a sitting position. SEEK MEDICAL CARE IF:  You still feel unsteady on your feet.  You develop new pain, for example in your armpits, back, shoulder, wrist, or hip.  You develop any numbness or tingling. SEEK IMMEDIATE MEDICAL CARE IF:  You fall.   This information is not intended to replace advice given to you by your health care provider. Make sure you discuss any questions you have with your health care provider.   Document  Released: 06/08/2000 Document Revised: 07/02/2014 Document Reviewed: 02/16/2013 Elsevier Interactive Patient Education [employer] Elsevier Inc.

## 2016-08-28 NOTE — Telephone Encounter (Signed)
See "Reason for call" 

## 2016-12-03 ENCOUNTER — Inpatient Hospital Stay (HOSPITAL_COMMUNITY)
Admission: AD | Admit: 2016-12-03 | Discharge: 2016-12-03 | Disposition: A | Discharge status: Home / Self Care | Payer: Medicaid Other | Source: Ambulatory Visit | Attending: Obstetrics and Gynecology | Admitting: Obstetrics and Gynecology

## 2016-12-03 ENCOUNTER — Encounter (HOSPITAL_COMMUNITY): Payer: Self-pay | Admitting: *Deleted

## 2016-12-03 DIAGNOSIS — F17210 Nicotine dependence, cigarettes, uncomplicated: Secondary | ICD-10-CM | POA: Insufficient documentation

## 2016-12-03 DIAGNOSIS — N926 Irregular menstruation, unspecified: Secondary | ICD-10-CM | POA: Diagnosis not present

## 2016-12-03 DIAGNOSIS — J45909 Unspecified asthma, uncomplicated: Secondary | ICD-10-CM | POA: Diagnosis not present

## 2016-12-03 DIAGNOSIS — R109 Unspecified abdominal pain: Secondary | ICD-10-CM | POA: Diagnosis present

## 2016-12-03 DIAGNOSIS — M545 Low back pain: Secondary | ICD-10-CM | POA: Diagnosis not present

## 2016-12-03 DIAGNOSIS — N939 Abnormal uterine and vaginal bleeding, unspecified: Secondary | ICD-10-CM | POA: Diagnosis present

## 2016-12-03 LAB — URINALYSIS, ROUTINE W REFLEX MICROSCOPIC
Bilirubin Urine: NEGATIVE
Glucose, UA: NEGATIVE mg/dL
KETONES UR: 20 mg/dL — AB
Leukocytes, UA: NEGATIVE
NITRITE: NEGATIVE
PH: 6 (ref 5.0–8.0)
Protein, ur: NEGATIVE mg/dL
Specific Gravity, Urine: 1.016 (ref 1.005–1.030)

## 2016-12-03 LAB — WET PREP, GENITAL
Clue Cells Wet Prep HPF POC: NONE SEEN
Sperm: NONE SEEN
TRICH WET PREP: NONE SEEN
WBC, Wet Prep HPF POC: NONE SEEN
Yeast Wet Prep HPF POC: NONE SEEN

## 2016-12-03 LAB — POCT PREGNANCY, URINE: Preg Test, Ur: NEGATIVE

## 2016-12-03 MED ORDER — IBUPROFEN 800 MG PO TABS
800.0000 mg | ORAL_TABLET | Freq: Once | ORAL | Status: DC
  Filled 2016-12-03: qty 1

## 2016-12-03 NOTE — MAU Provider Note (Signed)
Patient Hannah Rodriguez is a 36 y.o. [id number] at Unknown here because she missed her period at the end of May  and thinks she is having a miscarriage. She has not taken a home pregnancy test.   History     CSN: [id number]  Arrival date and time: 12/03/16 1127   None     Chief Complaint  Patient presents with  . Abdominal Pain  . Vaginal Bleeding  . Late Period   Vaginal Bleeding  The patient's primary symptoms include vaginal bleeding. The patient's pertinent negatives include no genital itching or genital lesions. This is a new problem. The current episode started in the past 7 days. The problem occurs constantly. Progression since onset: it comes and goes. She is not pregnant. Associated symptoms include back pain. The vaginal discharge was bloody. The vaginal bleeding is typical of menses. She has been passing clots. She has not been passing tissue. Nothing aggravates the symptoms. She has tried nothing for the symptoms. She uses nothing for contraception. Her menstrual history has been regular.   She also complains of some low back pain that is heavier than her normal back pain with her period.  OB History    Gravida Para Term Preterm AB Living   9 6 6   3 6    SAB TAB Ectopic Multiple Live Births   1 2   0 6      Obstetric Comments   Termination is confidential;      Past Medical History:  Diagnosis Date  . Anxiety   . Asthma   . Depression     Past Surgical History:  Procedure Laterality Date  . INDUCED ABORTION    . NO PAST SURGERIES      Family History  Problem Relation Age of Onset  . Heart disease Mother   . Cancer Maternal Uncle   . Heart disease Maternal Grandmother     Social History  Substance Use Topics  . Smoking status: Current Every Day Smoker    Packs/day: 0.50    Types: Cigarettes  . Smokeless tobacco: Never Used  . Alcohol use Yes     Comment: socially beer    Allergies:  Allergies  Allergen Reactions  . Codeine Nausea And Vomiting  .  Latex Other (See Comments)    Vaginal sensitivity    Prescriptions Prior to Admission  Medication Sig Dispense Refill Last Dose  . albuterol (PROVENTIL HFA;VENTOLIN HFA) 108 (90 Base) MCG/ACT inhaler Inhale 1-2 puffs into the lungs every 6 (six) hours as needed for wheezing or shortness of breath.   Past Week at Unknown time  . cyclobenzaprine (FLEXERIL) 10 MG tablet Take 10 mg by mouth at bedtime as needed for muscle spasms.   11/30/2016  . ibuprofen (ADVIL,MOTRIN) 800 MG tablet Take 800 mg by mouth every 8 (eight) hours as needed for headache or mild pain.   12/02/2016 at Unknown time    Review of Systems  Respiratory: Negative.   Cardiovascular: Negative.   Gastrointestinal: Negative.   Genitourinary: Positive for vaginal bleeding.  Musculoskeletal: Positive for back pain.  Neurological: Negative.   Psychiatric/Behavioral: Negative.    Physical Exam   Blood pressure 121/77, pulse 67, temperature 98.6 F (37 C), temperature source Oral, resp. rate 18, height 5\' 3"  (1.6 m), weight 157 lb (71.2 kg), last menstrual period 10/17/2016, SpO2 100 %, unknown if currently breastfeeding.  Physical Exam  Constitutional: She appears well-developed and well-nourished.  Neck: Normal range of motion.  Cardiovascular: Normal  rate.   Respiratory: Effort normal.  GI: Soft.  Genitourinary: Vagina normal.  Genitourinary Comments: NEFG; moderate amount of bright red blood in the vagina. No clots. Cervix is long and visually closed; no CMT or adnexal tenderness.   Musculoskeletal: Normal range of motion.  Neurological: She is alert.  Psychiatric: She has a normal mood and affect.    MAU Course  Procedures  MDM -UPT is negative here; no futher work up is necessary. -GC Ct pending -Wet prep: negative -800 ibuprofen for pain Patient is requesting to leave.  Assessment and Plan   1. Menstruation disorder    2. Patient stable for discharge. Information and phone number for [facility] given  to call  and make an appointment for contraceptive counseling.   Charlesetta Garibaldi Sherron Mapp CNM 12/03/2016, 1:44 PM

## 2016-12-03 NOTE — Discharge Instructions (Signed)

## 2016-12-03 NOTE — MAU Note (Signed)
Should have had period end of May.  Started having a lot of back pain, last wk, over the weekend started having dark blood and mucous. Passing clots.  Went to dr office this morning, they did not do a test, just told her to come here.  Some nausea

## 2016-12-04 LAB — GC/CHLAMYDIA PROBE AMP (~~LOC~~) NOT AT ARMC
CHLAMYDIA, DNA PROBE: NEGATIVE
NEISSERIA GONORRHEA: NEGATIVE

## 2016-12-07 ENCOUNTER — Encounter (HOSPITAL_COMMUNITY): Payer: Self-pay

## 2016-12-07 ENCOUNTER — Emergency Department (HOSPITAL_COMMUNITY)
Admission: EM | Admit: 2016-12-07 | Discharge: 2016-12-07 | Disposition: A | Discharge status: Home / Self Care | Payer: Medicaid Other | Attending: Emergency Medicine | Admitting: Emergency Medicine

## 2016-12-07 DIAGNOSIS — M545 Low back pain: Secondary | ICD-10-CM | POA: Diagnosis present

## 2016-12-07 DIAGNOSIS — Y33XXXA Other specified events, undetermined intent, initial encounter: Secondary | ICD-10-CM | POA: Diagnosis not present

## 2016-12-07 DIAGNOSIS — Y999 Unspecified external cause status: Secondary | ICD-10-CM | POA: Insufficient documentation

## 2016-12-07 DIAGNOSIS — Z91040 Latex allergy status: Secondary | ICD-10-CM | POA: Insufficient documentation

## 2016-12-07 DIAGNOSIS — Y929 Unspecified place or not applicable: Secondary | ICD-10-CM | POA: Insufficient documentation

## 2016-12-07 DIAGNOSIS — M5441 Lumbago with sciatica, right side: Secondary | ICD-10-CM | POA: Diagnosis not present

## 2016-12-07 DIAGNOSIS — J45909 Unspecified asthma, uncomplicated: Secondary | ICD-10-CM | POA: Insufficient documentation

## 2016-12-07 DIAGNOSIS — S8011XA Contusion of right lower leg, initial encounter: Secondary | ICD-10-CM | POA: Diagnosis not present

## 2016-12-07 DIAGNOSIS — Y939 Activity, unspecified: Secondary | ICD-10-CM | POA: Diagnosis not present

## 2016-12-07 DIAGNOSIS — F17210 Nicotine dependence, cigarettes, uncomplicated: Secondary | ICD-10-CM | POA: Diagnosis not present

## 2016-12-07 LAB — CBC WITH DIFFERENTIAL/PLATELET
Basophils Absolute: 0 10*3/uL (ref 0.0–0.1)
Basophils Relative: 1 %
EOS ABS: 0.1 10*3/uL (ref 0.0–0.7)
Eosinophils Relative: 2 %
HCT: 39.6 % (ref 36.0–46.0)
HEMOGLOBIN: 13.2 g/dL (ref 12.0–15.0)
Lymphocytes Relative: 34 %
Lymphs Abs: 1.4 10*3/uL (ref 0.7–4.0)
MCH: 30.3 pg (ref 26.0–34.0)
MCHC: 33.3 g/dL (ref 30.0–36.0)
MCV: 90.8 fL (ref 78.0–100.0)
Monocytes Absolute: 0.3 10*3/uL (ref 0.1–1.0)
Monocytes Relative: 6 %
NEUTROS ABS: 2.4 10*3/uL (ref 1.7–7.7)
NEUTROS PCT: 57 %
Platelets: 222 10*3/uL (ref 150–400)
RBC: 4.36 MIL/uL (ref 3.87–5.11)
RDW: 12.5 % (ref 11.5–15.5)
WBC: 4.1 10*3/uL (ref 4.0–10.5)

## 2016-12-07 LAB — COMPREHENSIVE METABOLIC PANEL
ALT: 12 U/L — ABNORMAL LOW (ref 14–54)
ANION GAP: 7 (ref 5–15)
AST: 17 U/L (ref 15–41)
Albumin: 4.3 g/dL (ref 3.5–5.0)
Alkaline Phosphatase: 67 U/L (ref 38–126)
BUN: 12 mg/dL (ref 6–20)
CHLORIDE: 107 mmol/L (ref 101–111)
CO2: 23 mmol/L (ref 22–32)
Calcium: 9.4 mg/dL (ref 8.9–10.3)
Creatinine, Ser: 0.57 mg/dL (ref 0.44–1.00)
Glucose, Bld: 98 mg/dL (ref 65–99)
POTASSIUM: 4.4 mmol/L (ref 3.5–5.1)
Sodium: 137 mmol/L (ref 135–145)
Total Bilirubin: 0.7 mg/dL (ref 0.3–1.2)
Total Protein: 7 g/dL (ref 6.5–8.1)

## 2016-12-07 LAB — I-STAT BETA HCG BLOOD, ED (MC, WL, AP ONLY): I-stat hCG, quantitative: <5 m[IU]/mL (ref <5)

## 2016-12-07 LAB — POC URINE PREG, ED: Preg Test, Ur: NEGATIVE

## 2016-12-07 LAB — PROTIME-INR
INR: 0.99
PROTHROMBIN TIME: 13.1 s (ref 11.4–15.2)

## 2016-12-07 LAB — CK: Total CK: 70 U/L (ref 38–234)

## 2016-12-07 MED ORDER — HYDROCODONE-ACETAMINOPHEN 5-325 MG PO TABS: 1.0000 | ORAL_TABLET | Freq: Four times a day (QID) | ORAL | 0 refills | Status: DC | PRN

## 2016-12-07 MED ORDER — HYDROCODONE-ACETAMINOPHEN 5-325 MG PO TABS
1.0000 | ORAL_TABLET | Freq: Once | ORAL | Status: AC
  Administered 2016-12-07: 1 via ORAL
  Filled 2016-12-07: qty 1

## 2016-12-07 NOTE — ED Triage Notes (Signed)
Pt reports lower back pain that stared last week and states she noticed bruising to her lower back and the pain radiates to her right leg and states there are bruises on her leg as well. Denies any recent injury. Denies pain anywhere else. Pt ambulatory.

## 2016-12-07 NOTE — ED Provider Notes (Signed)
[facility] Provider [facility]   CSN: [id number] Arrival date & time: 12/07/16  0825     History   Chief Complaint Chief Complaint  Patient presents with  . Back Pain    HPI Hannah Rodriguez is a 36 y.o. female.  The history is provided by the patient. No language interpreter was used.  Back Pain      Hannah Rodriguez is a 36 y.o. female who presents to the Emergency Department complaining of back pain.  She has a history of chronic low back pain but about a week ago she developed severe and sudden onset worsening of her low back pain predominantly on the right but it the pain was felt across her entire back. Her pain began to improve over time and then at the resolution of her pain she developed bruising to her right low back. Now her pain is radiating down her right leg it started in her thigh and goes down to her foot. She started to develop bruising in her right leg as well. She has decreased sensation to light touch over her entire right lower extremity and feels slightly weaker in the right compared to left. She denies any new injuries, fevers, vomiting, abdominal pain, dysuria.   Past Medical History:  Diagnosis Date  . Anxiety   . Asthma   . Depression     Patient Active Problem List   Diagnosis Date Noted  . Normal labor and delivery 10/15/2014  . NVD (normal vaginal delivery) 10/15/2014    Past Surgical History:  Procedure Laterality Date  . INDUCED ABORTION    . NO PAST SURGERIES      OB History    Gravida Para Term Preterm AB Living   9 6 6   3 6    SAB TAB Ectopic Multiple Live Births   1 2   0 6      Obstetric Comments   Termination is confidential;       Home Medications    Prior to Admission medications   Medication Sig Start Date End Date Taking? Authorizing Provider  albuterol (PROVENTIL HFA;VENTOLIN HFA) 108 (90 Base) MCG/ACT inhaler Inhale 1-2 puffs into the lungs every 6 (six) hours as needed for wheezing or shortness of breath.    Yes [provider]  cyclobenzaprine (FLEXERIL) 10 MG tablet Take 10 mg by mouth at bedtime as needed for muscle spasms.   Yes [provider]  ibuprofen (ADVIL,MOTRIN) 800 MG tablet Take 800 mg by mouth every 8 (eight) hours as needed for headache or mild pain.   Yes [provider]  HYDROcodone-acetaminophen (NORCO/VICODIN) 5-325 MG tablet Take 1 tablet by mouth every 6 (six) hours as needed for severe pain. 12/07/16   Tilden Fossaees, Jamyla Ard, MD    Family History Family History  Problem Relation Age of Onset  . Heart disease Mother   . Cancer Maternal Uncle   . Heart disease Maternal Grandmother     Social History Social History  Substance Use Topics  . Smoking status: Current Every Day Smoker    Packs/day: 0.50    Types: Cigarettes  . Smokeless tobacco: Never Used  . Alcohol use Yes     Comment: socially beer     Allergies   Codeine and Latex   Review of Systems Review of Systems  Musculoskeletal: Positive for back pain.  All other systems reviewed and are negative.    Physical Exam Updated Vital Signs BP 116/78   Pulse 80  Temp 98.6 F (37 C) (Oral)   Resp 16   LMP 12/07/2016   SpO2 99%   Physical Exam  Constitutional: She is oriented to person, place, and time. She appears well-developed and well-nourished.  HENT:  Head: Normocephalic and atraumatic.  Cardiovascular: Normal rate and regular rhythm.   No murmur heard. Pulmonary/Chest: Effort normal and breath sounds normal. No respiratory distress.  Abdominal: Soft. There is no tenderness. There is no rebound and no guarding.  Musculoskeletal: She exhibits no edema or tenderness.  No TTP over low back, BLE.  2+ DP pulses bilaterally. Multiple ovoid ecchymoses to the right lateral thigh in various stages of healing. Few scattered ecchymoses to the right medial calf. Few resolving ecchymoses over the right low back  Neurological: She is alert and oriented to person, place, and time.  5  out of 5 strength in bilateral lower extremities with altered sensation to light touch throughout the entire right lower extremity. Normal gait.  Skin: Skin is warm and dry.  Psychiatric: She has a normal mood and affect. Her behavior is normal.  Nursing note and vitals reviewed.    ED Treatments / Results  Labs (all labs ordered are listed, but only abnormal results are displayed) Labs Reviewed  COMPREHENSIVE METABOLIC PANEL - Abnormal; Notable for the following:       Result Value   ALT 12 (*)    All other components within normal limits  CBC WITH DIFFERENTIAL/PLATELET  PROTIME-INR  CK  POC URINE PREG, ED  I-STAT BETA HCG BLOOD, ED ([facility], [facility], [facility] ONLY)    EKG  EKG Interpretation None       Radiology No results found.  Procedures Procedures (including critical care time)  Medications Ordered in ED Medications  HYDROcodone-acetaminophen (NORCO/VICODIN) 5-325 MG per tablet 1 tablet (1 tablet Oral Given 12/07/16 1009)     Initial Impression / Assessment and Plan / ED Course  I have reviewed the triage vital signs and the nursing notes.  Pertinent labs & imaging results that were available during my care of the patient were reviewed by me and considered in my medical decision making (see chart for details).     Patient here for evaluation of low back pain radiating to the right lower extremity with ecchymosis to the right lower extremity. No evidence of coagulopathy based on lab analysis. Examination is consistent with some form of external trauma to her right leg although she does not recall any injuries. Presentation is not consistent with cauda equina, epidural abscess, limb ischemia, DVT. Discussed with patient home care for sciatica as well as close outpatient follow up and return precautions.  Final Clinical Impressions(s) / ED Diagnoses   Final diagnoses:  Acute midline low back pain with right-sided sciatica  Contusion of multiple sites of right lower extremity,  initial encounter    New Prescriptions Discharge Medication List as of 12/07/2016 12:10 PM    START taking these medications   Details  HYDROcodone-acetaminophen (NORCO/VICODIN) 5-325 MG tablet Take 1 tablet by mouth every 6 (six) hours as needed for severe pain., Starting Fri 12/07/2016, Print         Tilden Fossa, MD 12/08/16 [phone]

## 2018-03-22 ENCOUNTER — Ambulatory Visit (HOSPITAL_COMMUNITY)
Admission: EM | Admit: 2018-03-22 | Discharge: 2018-03-22 | Disposition: A | Discharge status: Home / Self Care | Payer: Self-pay | Attending: Family Medicine | Admitting: Family Medicine

## 2018-03-22 ENCOUNTER — Encounter (HOSPITAL_COMMUNITY): Payer: Self-pay | Admitting: Emergency Medicine

## 2018-03-22 DIAGNOSIS — L255 Unspecified contact dermatitis due to plants, except food: Secondary | ICD-10-CM

## 2018-03-22 MED ORDER — PREDNISONE 10 MG (48) PO TBPK: ORAL_TABLET | ORAL | 0 refills | Status: DC

## 2018-03-22 NOTE — ED Triage Notes (Signed)
Pt states on Thursday she felt a sting on her R arm and ever since a rash has spread on bilateral arms.

## 2018-04-01 NOTE — ED Provider Notes (Signed)
[facility]ARE [facility]ENTER   [id number] 03/22/18 Arrival Time: 1441  ASSESSMENT & PLAN:  1. Rhus dermatitis     Meds ordered this encounter  Medications  . predniSONE (STERAPRED UNI-PAK 48 TAB) 10 MG (48) TBPK tablet    Sig: Take as directed.    Dispense:  48 tablet    Refill:  0   Benadryl if needed. No signs of infection.  Will follow up with PCP or here if worsening or failing to improve as anticipated. Reviewed expectations re: course of current medical issues. Questions answered. Outlined signs and symptoms indicating need for more acute intervention. Patient verbalized understanding. After Visit Summary given.   SUBJECTIVE:  FATMA RUTTEN is a 37 y.o. female who presents with a skin complaint.   Location: bilateral arms Onset: gradual Duration: 3 days Associated pruritis? moderate Associated pain? none Progression: stable  Drainage? No  Known trigger? No ; but has been working in yard New soaps/lotions/topicals/detergents/environmental exposures? No Contacts with similar? No Recent travel? No  Other associated symptoms: none Therapies tried thus far: none Arthralgia or myalgia? none Recent illness? none Fever? none No specific aggravating or alleviating factors reported.  ROS: As per HPI.  OBJECTIVE: Vitals:   03/22/18 1546  BP: 121/72  Pulse: 79  Resp: 16  Temp: 98.6 F (37 C)  SpO2: 100%    General appearance: alert; no distress Lungs: clear to auscultation bilaterally Heart: regular rate and rhythm Extremities: no edema Skin: warm and dry; signs of infection: no; areas of linear papules and vesicles with surrounding erythema over bilateral upper extremities Psychological: alert and cooperative; normal mood and affect  Allergies  Allergen Reactions  . Codeine Nausea And Vomiting  . Latex Other (See Comments)    Vaginal sensitivity    Past Medical History:  Diagnosis Date  . Anxiety   . Asthma   . Depression    Social History    Socioeconomic History  . Marital status: Single    Spouse name: Not on file  . Number of children: Not on file  . Years of education: Not on file  . Highest education level: Not on file  Occupational History  . Not on file  Social Needs  . Financial resource strain: Not on file  . Food insecurity:    Worry: Not on file    Inability: Not on file  . Transportation needs:    Medical: Not on file    Non-medical: Not on file  Tobacco Use  . Smoking status: Current Every Day Smoker    Packs/day: 0.50    Types: Cigarettes  . Smokeless tobacco: Never Used  Substance and Sexual Activity  . Alcohol use: Yes    Comment: socially beer  . Drug use: No  . Sexual activity: Yes    Birth control/protection: None    Comment: pregnant  Lifestyle  . Physical activity:    Days per week: Not on file    Minutes per session: Not on file  . Stress: Not on file  Relationships  . Social connections:    Talks on phone: Not on file    Gets together: Not on file    Attends religious service: Not on file    Active member of club or organization: Not on file    Attends meetings of clubs or organizations: Not on file    Relationship status: Not on file  . Intimate partner violence:    Fear of current or ex partner: Not on file  Emotionally abused: Not on file    Physically abused: Not on file    Forced sexual activity: Not on file  Other Topics Concern  . Not on file  Social History Narrative  . Not on file   Family History  Problem Relation Age of Onset  . Heart disease Mother   . Cancer Maternal Uncle   . Heart disease Maternal Grandmother    Past Surgical History:  Procedure Laterality Date  . INDUCED ABORTION    . NO PAST SURGERIES       Mardella Layman, MD 04/01/18 [phone]

## 2019-04-02 ENCOUNTER — Other Ambulatory Visit: Payer: Self-pay

## 2019-04-02 DIAGNOSIS — Z20822 Contact with and (suspected) exposure to covid-19: Secondary | ICD-10-CM

## 2019-04-03 LAB — NOVEL CORONAVIRUS, NAA: SARS-CoV-2, NAA: NOT DETECTED

## 2019-11-26 ENCOUNTER — Other Ambulatory Visit: Payer: Self-pay | Admitting: Internal Medicine

## 2019-11-26 DIAGNOSIS — Z1231 Encounter for screening mammogram for malignant neoplasm of breast: Secondary | ICD-10-CM

## 2019-12-03 ENCOUNTER — Ambulatory Visit
Admission: RE | Admit: 2019-12-03 | Discharge: 2019-12-03 | Disposition: A | Discharge status: Home / Self Care | Payer: Medicaid Other | Source: Ambulatory Visit | Attending: Internal Medicine | Admitting: Internal Medicine

## 2019-12-03 ENCOUNTER — Other Ambulatory Visit: Payer: Self-pay

## 2019-12-03 ENCOUNTER — Ambulatory Visit: Payer: Medicaid Other

## 2019-12-03 DIAGNOSIS — Z1231 Encounter for screening mammogram for malignant neoplasm of breast: Secondary | ICD-10-CM

## 2019-12-08 ENCOUNTER — Other Ambulatory Visit: Payer: Self-pay | Admitting: Internal Medicine

## 2019-12-08 DIAGNOSIS — R928 Other abnormal and inconclusive findings on diagnostic imaging of breast: Secondary | ICD-10-CM

## 2019-12-11 ENCOUNTER — Other Ambulatory Visit: Payer: Self-pay | Admitting: Internal Medicine

## 2019-12-14 ENCOUNTER — Other Ambulatory Visit: Payer: Self-pay | Admitting: Internal Medicine

## 2019-12-14 ENCOUNTER — Ambulatory Visit
Admission: RE | Admit: 2019-12-14 | Discharge: 2019-12-14 | Disposition: A | Discharge status: Home / Self Care | Payer: Medicaid Other | Source: Ambulatory Visit | Attending: Internal Medicine | Admitting: Internal Medicine

## 2019-12-14 ENCOUNTER — Other Ambulatory Visit: Payer: Self-pay

## 2019-12-14 DIAGNOSIS — R928 Other abnormal and inconclusive findings on diagnostic imaging of breast: Secondary | ICD-10-CM

## 2019-12-14 DIAGNOSIS — N6310 Unspecified lump in the right breast, unspecified quadrant: Secondary | ICD-10-CM

## 2019-12-17 ENCOUNTER — Ambulatory Visit
Admission: RE | Admit: 2019-12-17 | Discharge: 2019-12-17 | Disposition: A | Discharge status: Home / Self Care | Payer: Medicaid Other | Source: Ambulatory Visit | Attending: Internal Medicine | Admitting: Internal Medicine

## 2019-12-17 ENCOUNTER — Other Ambulatory Visit: Payer: Self-pay

## 2019-12-17 DIAGNOSIS — N6310 Unspecified lump in the right breast, unspecified quadrant: Secondary | ICD-10-CM

## 2021-01-14 ENCOUNTER — Emergency Department (HOSPITAL_BASED_OUTPATIENT_CLINIC_OR_DEPARTMENT_OTHER)
Admission: EM | Admit: 2021-01-14 | Discharge: 2021-01-14 | Disposition: A | Discharge status: Home / Self Care | Payer: Medicaid Other | Attending: Emergency Medicine | Admitting: Emergency Medicine

## 2021-01-14 ENCOUNTER — Encounter (HOSPITAL_BASED_OUTPATIENT_CLINIC_OR_DEPARTMENT_OTHER): Payer: Self-pay

## 2021-01-14 ENCOUNTER — Other Ambulatory Visit: Payer: Self-pay

## 2021-01-14 DIAGNOSIS — N390 Urinary tract infection, site not specified: Secondary | ICD-10-CM | POA: Diagnosis not present

## 2021-01-14 DIAGNOSIS — M545 Low back pain, unspecified: Secondary | ICD-10-CM | POA: Diagnosis present

## 2021-01-14 DIAGNOSIS — Z8616 Personal history of COVID-19: Secondary | ICD-10-CM | POA: Diagnosis not present

## 2021-01-14 DIAGNOSIS — F17210 Nicotine dependence, cigarettes, uncomplicated: Secondary | ICD-10-CM | POA: Diagnosis not present

## 2021-01-14 DIAGNOSIS — J45909 Unspecified asthma, uncomplicated: Secondary | ICD-10-CM | POA: Insufficient documentation

## 2021-01-14 DIAGNOSIS — Z91040 Latex allergy status: Secondary | ICD-10-CM | POA: Diagnosis not present

## 2021-01-14 LAB — URINALYSIS, ROUTINE W REFLEX MICROSCOPIC
Bilirubin Urine: NEGATIVE
Glucose, UA: NEGATIVE mg/dL
Hgb urine dipstick: NEGATIVE
Ketones, ur: NEGATIVE mg/dL
Nitrite: NEGATIVE
Protein, ur: NEGATIVE mg/dL
Specific Gravity, Urine: 1.025 (ref 1.005–1.030)
pH: 6 (ref 5.0–8.0)

## 2021-01-14 LAB — PREGNANCY, URINE: Preg Test, Ur: NEGATIVE

## 2021-01-14 LAB — URINALYSIS, MICROSCOPIC (REFLEX): RBC / HPF: NONE SEEN RBC/hpf (ref 0–5)

## 2021-01-14 MED ORDER — NITROFURANTOIN MONOHYD MACRO 100 MG PO CAPS
100.0000 mg | ORAL_CAPSULE | Freq: Two times a day (BID) | ORAL | 0 refills | Status: AC
Start: 2021-01-14 — End: 2021-01-21

## 2021-01-14 MED ORDER — KETOROLAC TROMETHAMINE 10 MG PO TABS
10.0000 mg | ORAL_TABLET | Freq: Once | ORAL | Status: AC
  Administered 2021-01-14: 10 mg via ORAL
  Filled 2021-01-14: qty 1

## 2021-01-14 MED ORDER — NITROFURANTOIN MONOHYD MACRO 100 MG PO CAPS
100.0000 mg | ORAL_CAPSULE | Freq: Once | ORAL | Status: AC
  Administered 2021-01-14: 100 mg via ORAL
  Filled 2021-01-14: qty 1

## 2021-01-14 NOTE — ED Triage Notes (Signed)
Pt c/o bilateral lower back pain radiating into abdomen x 3 days. States has been constipated & some urinary retention.

## 2021-01-14 NOTE — ED Provider Notes (Signed)
[facility] EMERGENCY DEPARTMENT Provider Note   CSN: [id number] Arrival date & time: 01/14/21  1022     History Chief Complaint  Patient presents with   Back Pain   Abdominal Pain    Hannah Rodriguez is a 40 y.o. female.  JAREE DWIGHT has a history of sciatica.  She presents with lower back pain that stretches across her entire lumbar spine region.  It does not radiate.  In nursing notes it states that she is constipated and is having urinary retention.  She actually denied this to me.  She said that she had a little bit of constipation but it was really not anything to outside of normal.  She also states that she is not having urinary retention.  Rather, she is having urinary frequency.  She denies dysuria.  She denies any other neurologic symptoms.  She states that frequent position changes do tend to help a little bit.  The history is provided by the patient.  Back Pain Location:  Lumbar spine Quality:  Stabbing Radiates to:  Does not radiate Pain severity:  Moderate Pain is:  Same all the time Onset quality:  Gradual Duration:  3 days Timing:  Constant Progression:  Unchanged Chronicity:  New Context comment:  Had COVID a week ago, and she did not feel particularly ill. Unsure if this pain is related. Relieved by:  Nothing Worsened by:  Nothing Ineffective treatments:  NSAIDs Associated symptoms: no abdominal pain, no chest pain, no dysuria, no fever, no leg pain, no numbness, no paresthesias, no perianal numbness and no tingling       Past Medical History:  Diagnosis Date   Anxiety    Asthma    Depression     Patient Active Problem List   Diagnosis Date Noted   Normal labor and delivery 10/15/2014   NVD (normal vaginal delivery) 10/15/2014    Past Surgical History:  Procedure Laterality Date   INDUCED ABORTION     NO PAST SURGERIES       OB History     Gravida  9   Para  6   Term  6   Preterm      AB  3   Living  6      SAB   1   IAB  2   Ectopic      Multiple  0   Live Births  6        Obstetric Comments  Termination is confidential;         Family History  Problem Relation Age of Onset   Heart disease Mother    Cancer Maternal Uncle    Heart disease Maternal Grandmother     Social History   Tobacco Use   Smoking status: Every Day    Packs/day: 0.50    Types: Cigarettes   Smokeless tobacco: Never  Substance Use Topics   Alcohol use: Yes    Comment: socially beer   Drug use: No    Home Medications Prior to Admission medications   Medication Sig Start Date End Date Taking? Authorizing Provider  albuterol (PROVENTIL HFA;VENTOLIN HFA) 108 (90 Base) MCG/ACT inhaler Inhale 1-2 puffs into the lungs every 6 (six) hours as needed for wheezing or shortness of breath.    [provider]  cyclobenzaprine (FLEXERIL) 10 MG tablet Take 10 mg by mouth at bedtime as needed for muscle spasms.    [provider]  HYDROcodone-acetaminophen (NORCO/VICODIN) 5-325 MG tablet Take 1  tablet by mouth every 6 (six) hours as needed for severe pain. Patient not taking: Reported on 03/22/2018 12/07/16   Tilden Fossa, MD  ibuprofen (ADVIL,MOTRIN) 800 MG tablet Take 800 mg by mouth every 8 (eight) hours as needed for headache or mild pain.    [provider]  predniSONE (STERAPRED UNI-PAK 48 TAB) 10 MG (48) TBPK tablet Take as directed. 03/22/18   Mardella Layman, MD    Allergies    Codeine and Latex  Review of Systems   Review of Systems  Constitutional:  Negative for chills and fever.  HENT:  Negative for ear pain and sore throat.   Eyes:  Negative for pain and visual disturbance.  Respiratory:  Negative for cough and shortness of breath.   Cardiovascular:  Negative for chest pain and palpitations.  Gastrointestinal:  Negative for abdominal pain and vomiting.  Genitourinary:  Positive for frequency. Negative for dysuria and hematuria.  Musculoskeletal:  Positive for back pain.  Negative for arthralgias.  Skin:  Negative for color change and rash.  Neurological:  Negative for tingling, seizures, syncope, numbness and paresthesias.  All other systems reviewed and are negative.  Physical Exam Updated Vital Signs BP 117/89 (BP Location: Right Arm)   Pulse 71   Temp 98.2 F (36.8 C) (Oral)   Resp 18   Ht 5\' 1"  (1.549 m)   Wt 77.1 kg   LMP 01/10/2021 (Approximate)   SpO2 96%   BMI 32.12 kg/m   Physical Exam Vitals and nursing note reviewed.  HENT:     Head: Normocephalic and atraumatic.  Eyes:     General: No scleral icterus. Pulmonary:     Effort: Pulmonary effort is normal. No respiratory distress.  Abdominal:     General: There is no distension.     Palpations: Abdomen is soft.     Tenderness: There is no abdominal tenderness.  Musculoskeletal:     Cervical back: Normal range of motion.     Comments: Lumbar spine is normal to inspection.  She has full range of motion.  Lumbar spine extension does provoke a mild amount of pain.  Spine is nontender to palpation.  Hip range of motion is normal.  Straight leg test is normal.  Skin:    General: Skin is warm and dry.  Neurological:     Mental Status: She is alert.     Sensory: Sensation is intact.     Motor: Motor function is intact.     Deep Tendon Reflexes:     Reflex Scores:      Patellar reflexes are 2+ on the right side and 2+ on the left side.      Achilles reflexes are 2+ on the right side and 2+ on the left side. Psychiatric:        Mood and Affect: Mood normal.    ED Results / Procedures / Treatments   Labs (all labs ordered are listed, but only abnormal results are displayed) Labs Reviewed  URINALYSIS, ROUTINE W REFLEX MICROSCOPIC - Abnormal; Notable for the following components:      Result Value   APPearance HAZY (*)    Leukocytes,Ua TRACE (*)    All other components within normal limits  URINALYSIS, MICROSCOPIC (REFLEX) - Abnormal; Notable for the following components:    Bacteria, UA MANY (*)    All other components within normal limits  PREGNANCY, URINE    EKG None  Radiology No results found.  Procedures Procedures   Medications Ordered in ED  Medications  ketorolac (TORADOL) tablet 10 mg (10 mg Oral Given 01/14/21 1227)  nitrofurantoin (macrocrystal-monohydrate) (MACROBID) capsule 100 mg (100 mg Oral Given 01/14/21 1227)    ED Course  I have reviewed the triage vital signs and the nursing notes.  Pertinent labs & imaging results that were available during my care of the patient were reviewed by me and considered in my medical decision making (see chart for details).    MDM Rules/Calculators/A&P                           Angelica Pou presents with back pain and urinary frequency.  She did endorse a mild amount of abdominal pain that was very intermittent.  Exam was overall reassuring and was not significant for any acute neurologic pathology.  Abdominal exam was also soft, nontender, and not consistent with surgical pathology.  Symptoms are likely musculoskeletal or possibly related to cystitis.  I counseled the patient very carefully to return for further treatment if symptoms persist or worsen. Final Clinical Impression(s) / ED Diagnoses Final diagnoses:  Acute bilateral low back pain without sciatica  Urinary tract infection without hematuria, site unspecified    Rx / DC Orders ED Discharge Orders     None        Koleen Distance, MD 01/14/21 1244

## 2021-01-14 NOTE — ED Notes (Addendum)
Patient with bilateral flank pain, urinary retention

## 2021-05-02 IMAGING — MG MM BREAST LOCALIZATION CLIP
4 series · 4 of 12 positions shown · non-contrast
Comparison: Previous exam(s).

CLINICAL DATA: 39-year-old female status post ultrasound-guided
biopsy of the right breast.

EXAM:
DIAGNOSTIC RIGHT MAMMOGRAM POST ULTRASOUND BIOPSY

[R CC synth-2D]
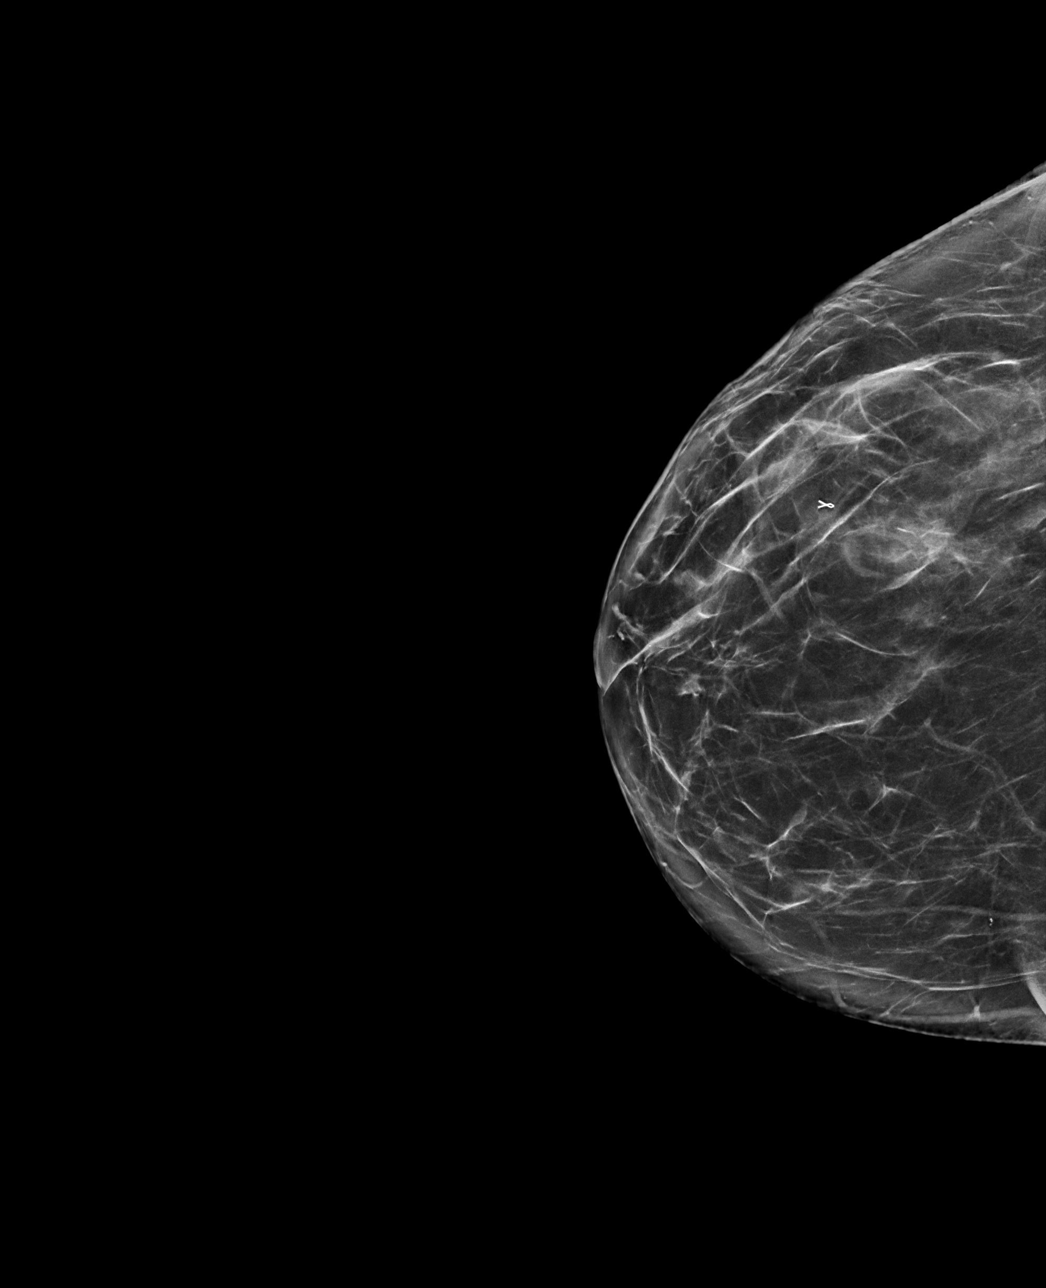

[R ML synth-2D]
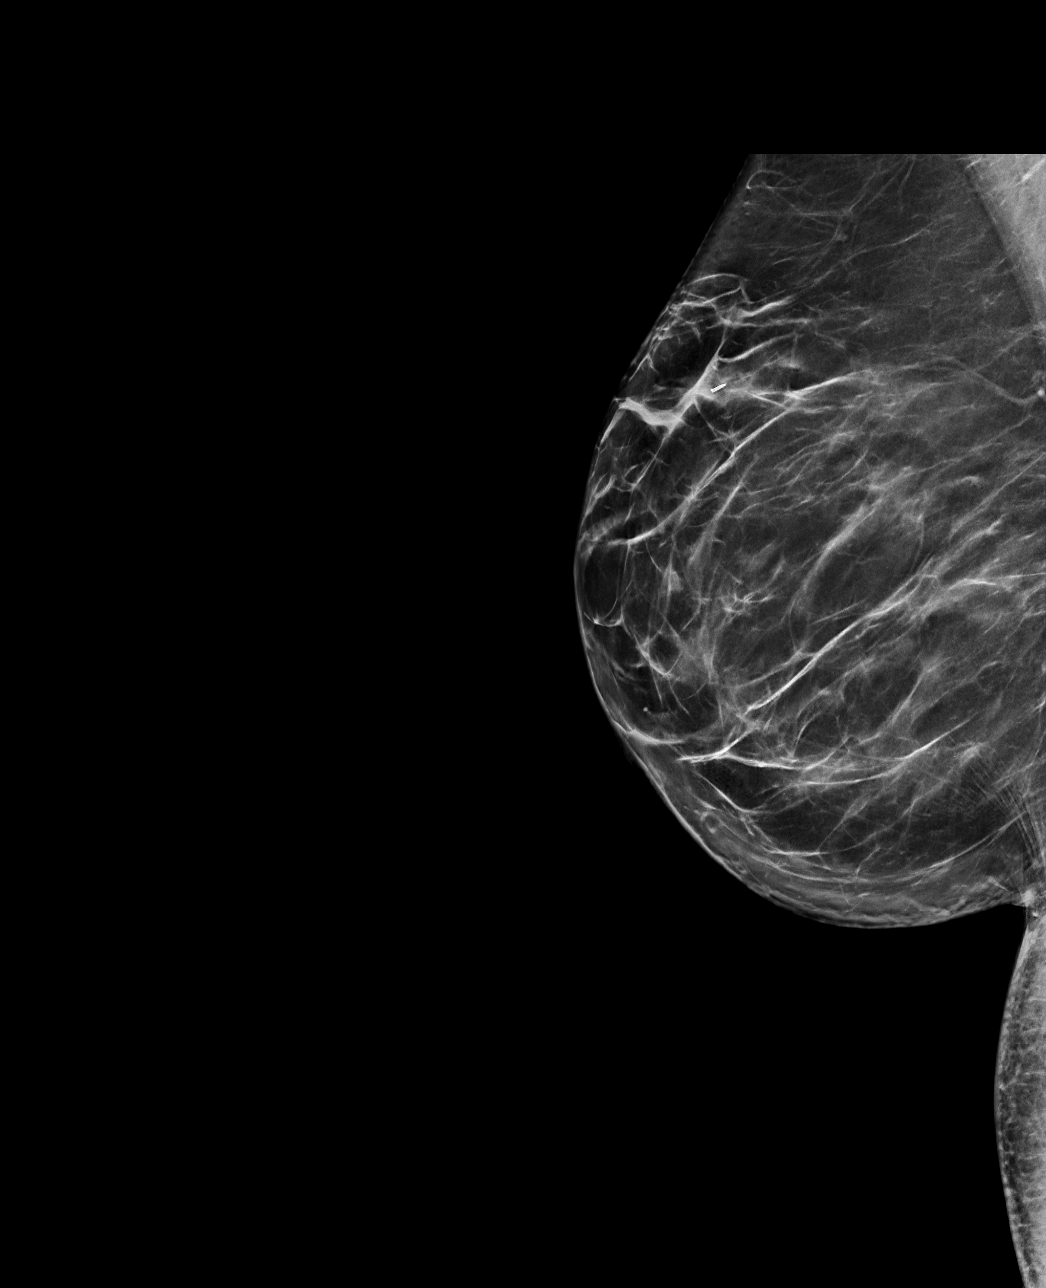

[R ML tomo · tomo slice 41/82.0]
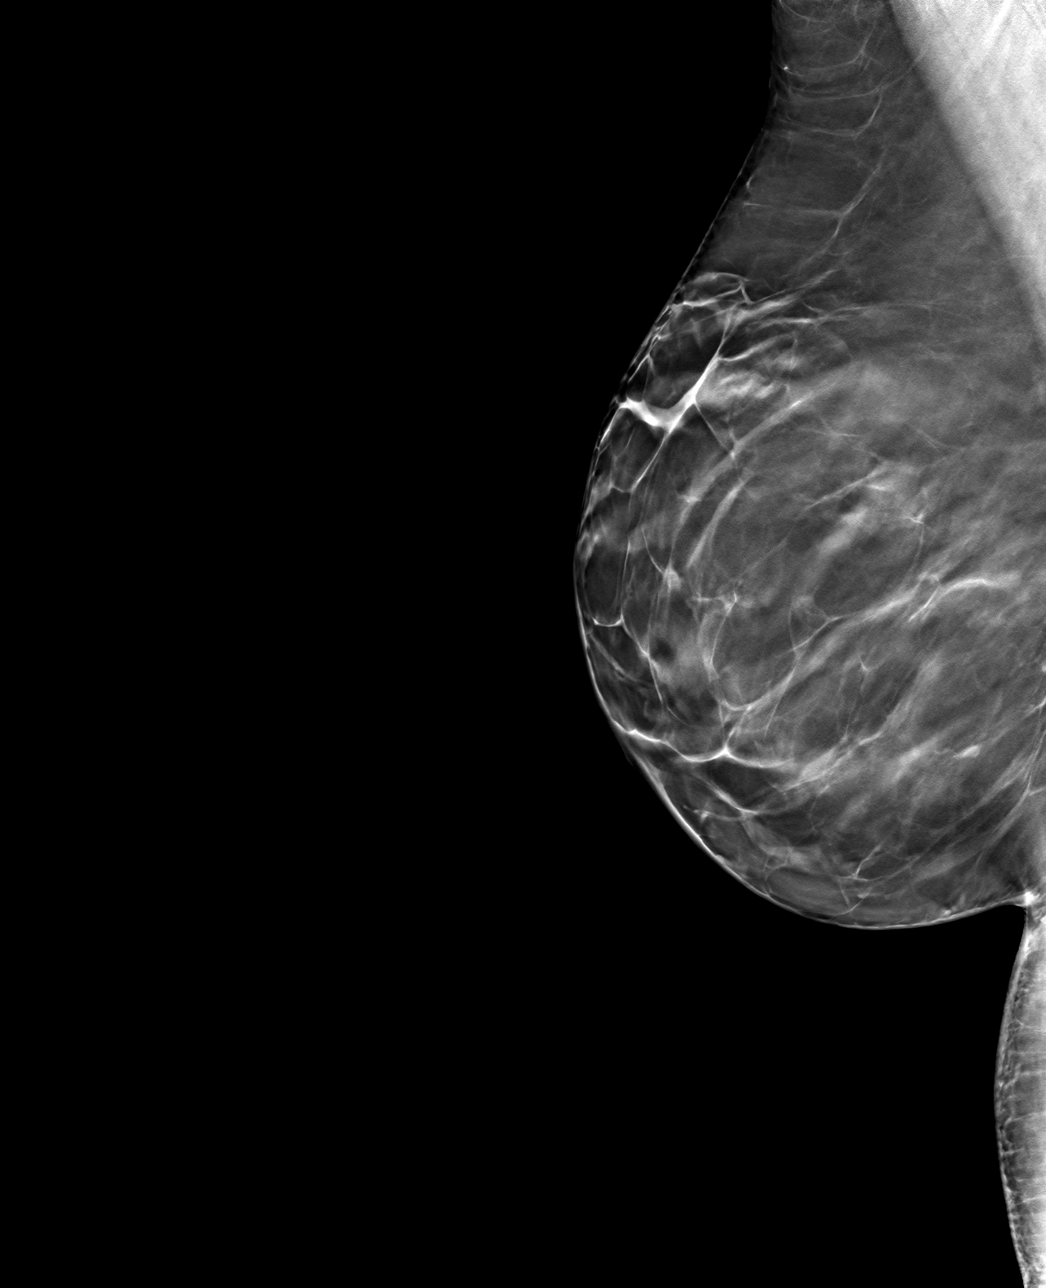

[R CC tomo · tomo slice 39/77.0]
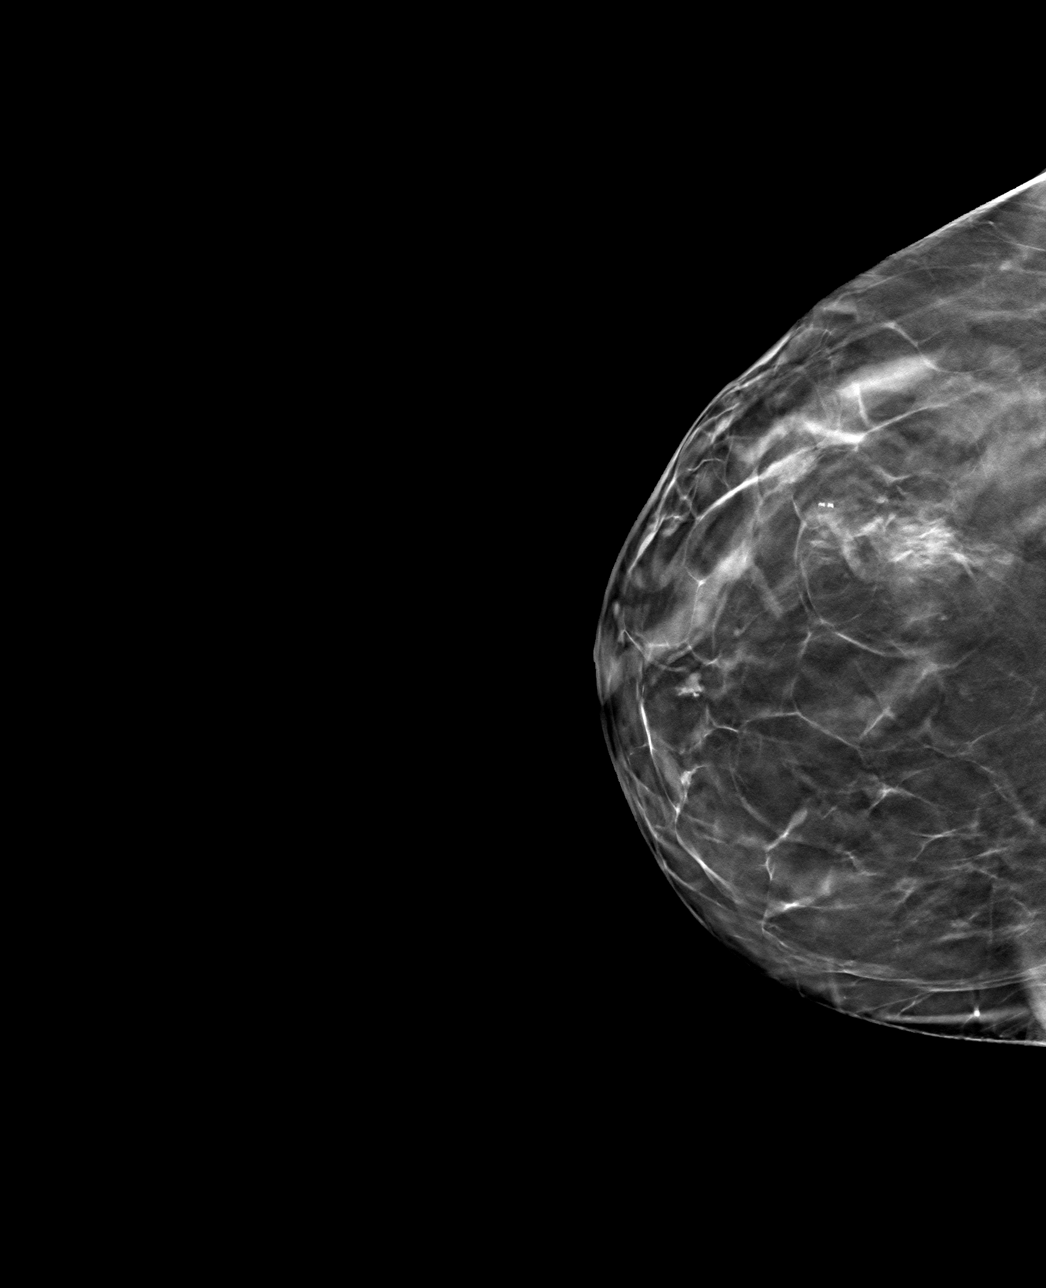

[4 of 12 positions shown; findings below may reference images not displayed]

FINDINGS: Mammographic images were obtained following ultrasound guided biopsy
of the right breast. The biopsy marking clip is in expected position
at the site of biopsy.
IMPRESSION: Appropriate positioning of the ribbon shaped biopsy marking clip at
the site of biopsy in the upper-outer right breast.

Final Assessment: Post Procedure Mammograms for Marker Placement

## 2021-12-03 ENCOUNTER — Ambulatory Visit (HOSPITAL_COMMUNITY)
Admission: EM | Admit: 2021-12-03 | Discharge: 2021-12-03 | Disposition: A | Discharge status: Home / Self Care | Payer: Medicaid Other | Attending: Emergency Medicine | Admitting: Emergency Medicine

## 2021-12-03 ENCOUNTER — Encounter (HOSPITAL_COMMUNITY): Payer: Self-pay

## 2021-12-03 DIAGNOSIS — Z20822 Contact with and (suspected) exposure to covid-19: Secondary | ICD-10-CM | POA: Diagnosis not present

## 2021-12-03 DIAGNOSIS — J029 Acute pharyngitis, unspecified: Secondary | ICD-10-CM | POA: Insufficient documentation

## 2021-12-03 DIAGNOSIS — R110 Nausea: Secondary | ICD-10-CM | POA: Diagnosis not present

## 2021-12-03 DIAGNOSIS — J45909 Unspecified asthma, uncomplicated: Secondary | ICD-10-CM | POA: Diagnosis not present

## 2021-12-03 DIAGNOSIS — J069 Acute upper respiratory infection, unspecified: Secondary | ICD-10-CM | POA: Insufficient documentation

## 2021-12-03 DIAGNOSIS — F17210 Nicotine dependence, cigarettes, uncomplicated: Secondary | ICD-10-CM | POA: Diagnosis not present

## 2021-12-03 DIAGNOSIS — R0981 Nasal congestion: Secondary | ICD-10-CM | POA: Diagnosis not present

## 2021-12-03 DIAGNOSIS — R519 Headache, unspecified: Secondary | ICD-10-CM | POA: Diagnosis not present

## 2021-12-03 DIAGNOSIS — R059 Cough, unspecified: Secondary | ICD-10-CM | POA: Insufficient documentation

## 2021-12-03 DIAGNOSIS — R509 Fever, unspecified: Secondary | ICD-10-CM | POA: Insufficient documentation

## 2021-12-03 LAB — POCT RAPID STREP A, ED / UC: Streptococcus, Group A Screen (Direct): NEGATIVE

## 2021-12-03 LAB — SARS CORONAVIRUS 2 (TAT 6-24 HRS): SARS Coronavirus 2: NEGATIVE

## 2021-12-03 MED ORDER — PROMETHAZINE-DM 6.25-15 MG/5ML PO SYRP: 5.0000 mL | ORAL_SOLUTION | Freq: Four times a day (QID) | ORAL | 0 refills | Status: DC | PRN

## 2021-12-03 MED ORDER — ALBUTEROL SULFATE HFA 108 (90 BASE) MCG/ACT IN AERS: 1.0000 | INHALATION_SPRAY | Freq: Four times a day (QID) | RESPIRATORY_TRACT | 0 refills | Status: DC | PRN

## 2021-12-03 MED ORDER — PREDNISONE 20 MG PO TABS: 40.0000 mg | ORAL_TABLET | Freq: Every day | ORAL | 0 refills | Status: DC

## 2021-12-03 NOTE — Discharge Instructions (Signed)
Today you are being treated for inflammation to your upper airways  Begin use of prednisone every morning with food for 5 days to help reduce inflammation   May use albuterol inhaler taking 2 puffs every 4 hours as needed to help calm shortness of breath and wheezing  May use promethazine DM for coughing and additional comfort, be mindful this medication may make you drowsy  For worsening signs of breathing please go to the nearest emergency department for evaluation  In addition:  Maintaining adequate hydration may help to thin secretions and soothe the respiratory mucosa   Warm Liquids- Ingestion of warm liquids may have a soothing effect on the respiratory mucosa, increase the flow of nasal mucus, and loosen respiratory secretions, making them easier to remove  May try honey (2.5 to 5 mL [0.5 to 1 teaspoon]) can be given straight or diluted in liquid (juice). Corn syrup may be substituted if honey is not available.

## 2021-12-03 NOTE — ED Provider Notes (Signed)
[facility]    CSN: [id number] Arrival date & time: 12/03/21  1222      History   Chief Complaint Chief Complaint  Patient presents with   Nasal Congestion   Sore Throat   Fever    HPI RONESHIA DREW is a 41 y.o. female.   Patient presents with feeling earlier, chills, body aches, nasal congestion, bilateral ear fullness, rhinorrhea, sore throat, nonproductive cough, intermittent wheezing, intermittent generalized headaches and nausea without vomiting for 5 days.  No known sick contacts.  Tolerating food and liquids.  Has attempted use of cetirizine and DayQuil which have been minimally helpful.  Daily smoker.  History of asthma.  Past Medical History:  Diagnosis Date   Anxiety    Asthma    Depression     Patient Active Problem List   Diagnosis Date Noted   Normal labor and delivery 10/15/2014   NVD (normal vaginal delivery) 10/15/2014    Past Surgical History:  Procedure Laterality Date   INDUCED ABORTION     NO PAST SURGERIES      OB History     Gravida  9   Para  6   Term  6   Preterm      AB  3   Living  6      SAB  1   IAB  2   Ectopic      Multiple  0   Live Births  6        Obstetric Comments  Termination is confidential;          Home Medications    Prior to Admission medications   Medication Sig Start Date End Date Taking? Authorizing Provider  albuterol (PROVENTIL HFA;VENTOLIN HFA) 108 (90 Base) MCG/ACT inhaler Inhale 1-2 puffs into the lungs every 6 (six) hours as needed for wheezing or shortness of breath. Patient not taking: Reported on 12/03/2021    [provider]  cyclobenzaprine (FLEXERIL) 10 MG tablet Take 10 mg by mouth at bedtime as needed for muscle spasms.    [provider]  HYDROcodone-acetaminophen (NORCO/VICODIN) 5-325 MG tablet Take 1 tablet by mouth every 6 (six) hours as needed for severe pain. Patient not taking: Reported on 03/22/2018 12/07/16   Tilden Fossa, MD   ibuprofen (ADVIL,MOTRIN) 800 MG tablet Take 800 mg by mouth every 8 (eight) hours as needed for headache or mild pain.    [provider]  predniSONE (STERAPRED UNI-PAK 48 TAB) 10 MG (48) TBPK tablet Take as directed. 03/22/18   Mardella Layman, MD    Family History Family History  Problem Relation Age of Onset   Heart disease Mother    Cancer Maternal Uncle    Heart disease Maternal Grandmother     Social History Social History   Tobacco Use   Smoking status: Every Day    Packs/day: 1.00    Types: Cigarettes   Smokeless tobacco: Never  Substance Use Topics   Alcohol use: Yes    Comment: socially beer   Drug use: No     Allergies   Codeine and Latex   Review of Systems Review of Systems  Constitutional:  Positive for chills and fever. Negative for activity change, appetite change, diaphoresis, fatigue and unexpected weight change.  HENT:  Positive for congestion, ear pain, rhinorrhea and sore throat. Negative for dental problem, drooling, ear discharge, facial swelling, hearing loss, mouth sores, nosebleeds, postnasal drip, sinus pressure, sinus pain, sneezing, tinnitus, trouble swallowing and voice change.  Respiratory:  Positive for cough and wheezing. Negative for apnea, choking, chest tightness, shortness of breath and stridor.   Cardiovascular: Negative.   Gastrointestinal:  Positive for nausea. Negative for abdominal distention, abdominal pain, anal bleeding, blood in stool, constipation, diarrhea, rectal pain and vomiting.  Skin: Negative.   Neurological:  Positive for headaches. Negative for dizziness, tremors, seizures, syncope, facial asymmetry, speech difficulty, weakness, light-headedness and numbness.     Physical Exam Triage Vital Signs ED Triage Vitals  Enc Vitals Group     BP 12/03/21 1338 127/78     Pulse Rate 12/03/21 1338 60     Resp 12/03/21 1338 20     Temp 12/03/21 1338 98.3 F (36.8 C)     Temp src --      SpO2 12/03/21 1338 99 %      Weight --      Height --      Head Circumference --      Peak Flow --      Pain Score 12/03/21 1336 5     Pain Loc --      Pain Edu? --      Excl. in GC? --    No data found.  Updated Vital Signs BP 127/78   Pulse 60   Temp 98.3 F (36.8 C)   Resp 20   SpO2 99%   Visual Acuity Right Eye Distance:   Left Eye Distance:   Bilateral Distance:    Right Eye Near:   Left Eye Near:    Bilateral Near:     Physical Exam Constitutional:      Appearance: Normal appearance.  HENT:     Head: Normocephalic.     Right Ear: Hearing, ear canal and external ear normal. A middle ear effusion is present.     Left Ear: Hearing, ear canal and external ear normal. A middle ear effusion is present.     Nose: Congestion and rhinorrhea present.     Mouth/Throat:     Pharynx: Oropharynx is clear. Posterior oropharyngeal erythema present.     Tonsils: No tonsillar exudate. 0 on the right. 0 on the left.  Eyes:     Extraocular Movements: Extraocular movements intact.  Cardiovascular:     Rate and Rhythm: Normal rate and regular rhythm.     Pulses: Normal pulses.     Heart sounds: Normal heart sounds.  Pulmonary:     Effort: Pulmonary effort is normal.     Breath sounds: Normal breath sounds.  Musculoskeletal:        General: Normal range of motion.     Cervical back: Normal range of motion and neck supple.  Skin:    General: Skin is warm and dry.  Neurological:     Mental Status: She is alert and oriented to person, place, and time. Mental status is at baseline.  Psychiatric:        Mood and Affect: Mood normal.        Behavior: Behavior normal.      UC Treatments / Results  Labs (all labs ordered are listed, but only abnormal results are displayed) Labs Reviewed - No data to display  EKG   Radiology No results found.  Procedures Procedures (including critical care time)  Medications Ordered in UC Medications - No data to display  Initial Impression / Assessment and  Plan / UC Course  I have reviewed the triage vital signs and the nursing notes.  Pertinent labs & imaging results that  were available during my care of the patient were reviewed by me and considered in my medical decision making (see chart for details).  Viral URI with cough  Vital signs are stable, O2 saturation 99% on room air, wheezing heard to auscultation, discussed findings with patient, rapid strep test negative, sent for culture, COVID test pending, prescribed albuterol inhaler as patient endorses hers is out, prednisone burst and Promethazine DM for management of coughing and wheezing, may continue use of over-the-counter medications as needed for additional support, may follow-up with this urgent care as needed for reevaluation, work note given Final Clinical Impressions(s) / UC Diagnoses   Final diagnoses:  None   Discharge Instructions   None    ED Prescriptions   None    PDMP not reviewed this encounter.   Valinda HoarWhite, Tully Mcinturff R, NP 12/03/21 1504

## 2021-12-03 NOTE — ED Triage Notes (Signed)
Patient presents to Urgent Care with complaints of congestion, sore throat, cough, sob with pmh of asthma since about 5 days ago. Patient reports allergy medication, and cold and cough medicaitons.

## 2021-12-05 LAB — CULTURE, GROUP A STREP (THRC)

## 2022-03-19 ENCOUNTER — Other Ambulatory Visit: Payer: Self-pay

## 2022-03-19 ENCOUNTER — Emergency Department (HOSPITAL_BASED_OUTPATIENT_CLINIC_OR_DEPARTMENT_OTHER): Payer: Medicaid Other

## 2022-03-19 ENCOUNTER — Emergency Department (HOSPITAL_BASED_OUTPATIENT_CLINIC_OR_DEPARTMENT_OTHER)
Admission: EM | Admit: 2022-03-19 | Discharge: 2022-03-19 | Disposition: A | Discharge status: Home / Self Care | Payer: Medicaid Other | Attending: Emergency Medicine | Admitting: Emergency Medicine

## 2022-03-19 ENCOUNTER — Emergency Department (HOSPITAL_COMMUNITY): Payer: Medicaid Other

## 2022-03-19 ENCOUNTER — Encounter (HOSPITAL_BASED_OUTPATIENT_CLINIC_OR_DEPARTMENT_OTHER): Payer: Self-pay

## 2022-03-19 DIAGNOSIS — M545 Low back pain, unspecified: Secondary | ICD-10-CM

## 2022-03-19 DIAGNOSIS — N200 Calculus of kidney: Secondary | ICD-10-CM | POA: Diagnosis not present

## 2022-03-19 DIAGNOSIS — Z91040 Latex allergy status: Secondary | ICD-10-CM | POA: Diagnosis not present

## 2022-03-19 DIAGNOSIS — R109 Unspecified abdominal pain: Secondary | ICD-10-CM | POA: Diagnosis present

## 2022-03-19 LAB — URINALYSIS, ROUTINE W REFLEX MICROSCOPIC
Bilirubin Urine: NEGATIVE
Glucose, UA: NEGATIVE mg/dL
Hgb urine dipstick: NEGATIVE
Ketones, ur: NEGATIVE mg/dL
Leukocytes,Ua: NEGATIVE
Nitrite: NEGATIVE
Specific Gravity, Urine: 1.033 — ABNORMAL HIGH (ref 1.005–1.030)
pH: 6.5 (ref 5.0–8.0)

## 2022-03-19 LAB — CBC WITH DIFFERENTIAL/PLATELET
Abs Immature Granulocytes: 0.02 10*3/uL (ref 0.00–0.07)
Basophils Absolute: 0 10*3/uL (ref 0.0–0.1)
Basophils Relative: 1 %
Eosinophils Absolute: 0.1 10*3/uL (ref 0.0–0.5)
Eosinophils Relative: 3 %
HCT: 37.1 % (ref 36.0–46.0)
Hemoglobin: 12.6 g/dL (ref 12.0–15.0)
Immature Granulocytes: 0 %
Lymphocytes Relative: 31 %
Lymphs Abs: 1.4 10*3/uL (ref 0.7–4.0)
MCH: 30.4 pg (ref 26.0–34.0)
MCHC: 34 g/dL (ref 30.0–36.0)
MCV: 89.4 fL (ref 80.0–100.0)
Monocytes Absolute: 0.4 10*3/uL (ref 0.1–1.0)
Monocytes Relative: 9 %
Neutro Abs: 2.6 10*3/uL (ref 1.7–7.7)
Neutrophils Relative %: 56 %
Platelets: 224 10*3/uL (ref 150–400)
RBC: 4.15 MIL/uL (ref 3.87–5.11)
RDW: 12.8 % (ref 11.5–15.5)
WBC: 4.6 10*3/uL (ref 4.0–10.5)
nRBC: 0 % (ref 0.0–0.2)

## 2022-03-19 LAB — HEPATIC FUNCTION PANEL
ALT: 14 U/L (ref 0–44)
AST: 14 U/L — ABNORMAL LOW (ref 15–41)
Albumin: 4 g/dL (ref 3.5–5.0)
Alkaline Phosphatase: 55 U/L (ref 38–126)
Bilirubin, Direct: 0.1 mg/dL (ref 0.0–0.2)
Indirect Bilirubin: 0.4 mg/dL (ref 0.3–0.9)
Total Bilirubin: 0.5 mg/dL (ref 0.3–1.2)
Total Protein: 6.3 g/dL — ABNORMAL LOW (ref 6.5–8.1)

## 2022-03-19 LAB — BASIC METABOLIC PANEL
Anion gap: 7 (ref 5–15)
BUN: 13 mg/dL (ref 6–20)
CO2: 23 mmol/L (ref 22–32)
Calcium: 8.7 mg/dL — ABNORMAL LOW (ref 8.9–10.3)
Chloride: 106 mmol/L (ref 98–111)
Creatinine, Ser: 0.54 mg/dL (ref 0.44–1.00)
GFR, Estimated: >60 mL/min (ref >60)
Glucose, Bld: 90 mg/dL (ref 70–99)
Potassium: 4 mmol/L (ref 3.5–5.1)
Sodium: 136 mmol/L (ref 135–145)

## 2022-03-19 LAB — PREGNANCY, URINE: Preg Test, Ur: NEGATIVE

## 2022-03-19 LAB — LIPASE, BLOOD: Lipase: 14 U/L (ref 11–51)

## 2022-03-19 MED ORDER — PREDNISONE 50 MG PO TABS: ORAL_TABLET | ORAL | 0 refills | Status: DC

## 2022-03-19 MED ORDER — ONDANSETRON HCL 4 MG/2ML IJ SOLN
4.0000 mg | Freq: Once | INTRAMUSCULAR | Status: AC
  Administered 2022-03-19: 4 mg via INTRAVENOUS
  Filled 2022-03-19: qty 2

## 2022-03-19 MED ORDER — FENTANYL CITRATE PF 50 MCG/ML IJ SOSY
50.0000 ug | PREFILLED_SYRINGE | Freq: Once | INTRAMUSCULAR | Status: AC
  Administered 2022-03-19: 50 ug via INTRAVENOUS
  Filled 2022-03-19: qty 1

## 2022-03-19 MED ORDER — KETOROLAC TROMETHAMINE 30 MG/ML IJ SOLN
15.0000 mg | Freq: Once | INTRAMUSCULAR | Status: AC
  Administered 2022-03-19: 15 mg via INTRAVENOUS
  Filled 2022-03-19: qty 1

## 2022-03-19 MED ORDER — SODIUM CHLORIDE 0.9 % IV BOLUS
1000.0000 mL | Freq: Once | INTRAVENOUS | Status: AC
  Administered 2022-03-19: 1000 mL via INTRAVENOUS

## 2022-03-19 NOTE — ED Triage Notes (Signed)
Pt states right sided flank pain x 5 days. Pt states she has had nausea. Pt states that she has hx of sciatica, originally thought it was that but pain does not radiate down her leg.

## 2022-03-19 NOTE — ED Notes (Signed)
Discharge instructions reviewed with patient. Patient denies any questions or concerns. Patient ambulatory out of ED. 

## 2022-03-19 NOTE — ED Provider Notes (Signed)
Pt here for MRI.  Pt sent here [name] for a  MRi of her Ls spine.  Pt has had low back pain on and off for 5 years but had a recent increase in pain.   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    Pt counseled on results.  Pt given number to follow up with Dr. Ronnald [facility].  I will try pt on 6 days of prednisone.  Pt request note for employer    Hannah Rodriguez 03/19/22 1448    Davonna Belling, MD 03/20/22 1243

## 2022-03-19 NOTE — ED Notes (Signed)
Pt attempting to void for UA

## 2022-03-19 NOTE — ED Provider Notes (Signed)
[facility] Provider Note   CSN: [id number] Arrival date & time: 03/19/22  0735     History  Chief Complaint  Patient presents with   Flank Pain    Hannah Rodriguez is a 41 y.o. female.  Patient presents with a 5-day history of right-sided low back pain described as "aching and contractions".  Describes a squeezing pain that comes and goes lasting for several hours at a time.  Taking ibuprofen without relief.  Has a history of sciatica initially thought it was this but does not radiate down her leg.  No weakness, numbness, tingling.  She has noticed some urinary frequency and urgency and "leaking a few drops of urine" if I do not get to the bathroom on time.  States sometimes she will be sitting on the couch and the next thing she knows she is dripping some urine and has to rush to the bathroom. No bowel incontinence.  No fever.  Has had nausea but no vomiting.  Good appetite.  No diarrhea. No history of IV drug abuse or cancer. No history of kidney stones.  The history is provided by the patient.  Flank Pain       Home Medications Prior to Admission medications   Medication Sig Start Date End Date Taking? Authorizing Provider  albuterol (VENTOLIN HFA) 108 (90 Base) MCG/ACT inhaler Inhale 1-2 puffs into the lungs every 6 (six) hours as needed for wheezing or shortness of breath. 12/03/21   White, Leitha Schuller, NP  cyclobenzaprine (FLEXERIL) 10 MG tablet Take 10 mg by mouth at bedtime as needed for muscle spasms.    [provider]  HYDROcodone-acetaminophen (NORCO/VICODIN) 5-325 MG tablet Take 1 tablet by mouth every 6 (six) hours as needed for severe pain. Patient not taking: Reported on 03/22/2018 12/07/16   Quintella Reichert, MD  ibuprofen (ADVIL,MOTRIN) 800 MG tablet Take 800 mg by mouth every 8 (eight) hours as needed for headache or mild pain.    [provider]  predniSONE (DELTASONE) 20 MG tablet Take 2 tablets (40 mg total) by mouth  daily. 12/03/21   White, Leitha Schuller, NP  promethazine-dextromethorphan (PROMETHAZINE-DM) 6.25-15 MG/5ML syrup Take 5 mLs by mouth 4 (four) times daily as needed for cough. 12/03/21   Hans Eden, NP      Allergies    Codeine and Latex    Review of Systems   Review of Systems  Constitutional:  Negative for activity change, appetite change and fever.  Genitourinary:  Positive for flank pain, frequency and urgency.  Neurological:  Negative for dizziness and weakness.   all other systems are negative except as noted in the HPI and PMH.    Physical Exam Updated Vital Signs BP (!) 133/97 (BP Location: Right Arm)   Pulse 73   Temp 98.3 F (36.8 C) (Oral)   Resp 16   Ht 5\' 1"  (1.549 m)   Wt 84.7 kg   LMP 02/18/2022   SpO2 100%   BMI 35.30 kg/m  Physical Exam Vitals and nursing note reviewed.  Constitutional:      General: She is not in acute distress.    Appearance: She is well-developed.  HENT:     Head: Normocephalic and atraumatic.     Mouth/Throat:     Pharynx: No oropharyngeal exudate.  Eyes:     Conjunctiva/sclera: Conjunctivae normal.     Pupils: Pupils are equal, round, and reactive to light.  Neck:     Comments: No meningismus. Cardiovascular:  Rate and Rhythm: Normal rate and regular rhythm.     Heart sounds: Normal heart sounds. No murmur heard. Pulmonary:     Effort: Pulmonary effort is normal. No respiratory distress.     Breath sounds: Normal breath sounds.  Abdominal:     Palpations: Abdomen is soft.     Tenderness: There is no abdominal tenderness. There is no guarding or rebound.  Musculoskeletal:        General: Tenderness present. Normal range of motion.     Cervical back: Normal range of motion and neck supple.     Comments: R parasapinal lumbar tenderness  5/5 strength in bilateral lower extremities. Ankle plantar and dorsiflexion intact. Great toe extension intact bilaterally. +2 DP and PT pulses. +2 patellar reflexes bilaterally. Normal  gait.   Skin:    General: Skin is warm.  Neurological:     Mental Status: She is alert and oriented to person, place, and time.     Cranial Nerves: No cranial nerve deficit.     Motor: No abnormal muscle tone.     Coordination: Coordination normal.     Comments:  5/5 strength throughout. CN 2-12 intact.Equal grip strength.   Psychiatric:        Behavior: Behavior normal.     ED Results / Procedures / Treatments   Labs (all labs ordered are listed, but only abnormal results are displayed) Labs Reviewed  URINALYSIS, ROUTINE W REFLEX MICROSCOPIC - Abnormal; Notable for the following components:      Result Value   Specific Gravity, Urine 1.033 (*)    Protein, ur TRACE (*)    All other components within normal limits  BASIC METABOLIC PANEL - Abnormal; Notable for the following components:   Calcium 8.7 (*)    All other components within normal limits  PREGNANCY, URINE  CBC WITH DIFFERENTIAL/PLATELET    EKG None  Radiology CT Renal Stone Study  Result Date: 03/19/2022 CLINICAL DATA:  41 year old female with history of right-sided flank pain for the past 5 days. Suspected kidney stone. EXAM: CT ABDOMEN AND PELVIS WITHOUT CONTRAST TECHNIQUE: Multidetector CT imaging of the abdomen and pelvis was performed following the standard protocol without IV contrast. RADIATION DOSE REDUCTION: This exam was performed according to the departmental dose-optimization program which includes automated exposure control, adjustment of the mA and/or kV according to patient size and/or use of iterative reconstruction technique. COMPARISON:  No priors. FINDINGS: Lower chest: Unremarkable. Hepatobiliary: No suspicious cystic or solid hepatic lesions are confidently identified on today's noncontrast CT examination. 1.8 cm peripherally calcified gallstone near the neck of the gallbladder. Gallbladder does not appear distended. No pericholecystic fluid or surrounding inflammatory changes. Pancreas: No definite  pancreatic mass or peripancreatic fluid collections or inflammatory changes are noted on today's noncontrast CT examination. Spleen: Unremarkable. Adrenals/Urinary Tract: Several tiny 1-2 mm nonobstructive calculi are noted within the left renal collecting system. No additional calculi are noted within the collecting system of the right kidney, along the course of either ureter, or within the lumen of the urinary bladder. No hydroureteronephrosis. Multiple small renal lesions subcentimeter in size ranging from low to high attenuation, incompletely characterized on today's noncontrast CT examination, but statistically likely tiny cysts and proteinaceous cysts. Bilateral adrenal glands are normal in appearance. Urinary bladder is nearly completely decompressed, but otherwise unremarkable in appearance. Stomach/Bowel: The appearance of the stomach is normal. No pathologic dilatation of small bowel or colon. Normal appendix. Vascular/Lymphatic: Atherosclerotic calcifications in the pelvic vasculature. No lymphadenopathy noted in the  abdomen or pelvis. Reproductive: Uterus and ovaries are unremarkable in appearance. Other: No significant volume of ascites.  No pneumoperitoneum. Musculoskeletal: There are no aggressive appearing lytic or blastic lesions noted in the visualized portions of the skeleton. IMPRESSION: 1. Tiny 1-2 mm nonobstructive calculi in the left renal collecting system. No ureteral stones or findings of urinary tract obstruction are noted at this time. 2. Several subcentimeter renal lesions bilaterally varying degrees of attenuation, suggesting tiny cysts and proteinaceous/hemorrhagic cysts. If of clinical concern, these could be definitively characterized with follow-up nonemergent outpatient abdominal MRI with and without IV gadolinium. 3. Cholelithiasis without evidence of acute cholecystitis. 4. Aortic atherosclerosis. Electronically Signed   By: Vinnie Langton M.D.   On: 03/19/2022 08:55     Procedures Procedures    Medications Ordered in ED Medications  ondansetron (ZOFRAN) injection 4 mg (has no administration in time range)  fentaNYL (SUBLIMAZE) injection 50 mcg (has no administration in time range)  sodium chloride 0.9 % bolus 1,000 mL (has no administration in time range)    ED Course/ Medical Decision Making/ A&P                           Medical Decision Making Amount and/or Complexity of Data Reviewed Labs: ordered. Decision-making details documented in ED Course. Radiology: ordered and independent interpretation performed. Decision-making details documented in ED Course. ECG/medicine tests: ordered and independent interpretation performed. Decision-making details documented in ED Course.  Risk Prescription drug management.  5 days of right flank pain with nausea and urinary frequency and urgency.  No fever.  No vomiting.  Intact strength, sensation, pulses and reflexes.  Low suspicion for cord compression or cauda equina.  Patient does describe urge incontinence and needing to get to the bathroom frequently and urgently. We will evaluate for kidney stone.   Urinalysis is negative.  Electrolytes are reassuring.  CT scan obtained to evaluate for kidney stone.  No ureteral stone seen.  Does have punctate densities in left kidney, this opposite side of her pain.  Consider passed kidney stone.  However she is having episodes of incontinence and cannot make it to the bathroom on time.  Postvoid residual is 9 mL.  No evidence of urinary retention.  Presentation is suspicious for passed kidney stone but patient's incontinence is concerning. She is agreeable to transfer for MRI to rule out spinal cord pathology.  Dr. Alvino Chapel accepts patient for transfer.  She will drive herself.  Instructed to go straight to [facility].  Do not eat or drink anything on the way.       Final Clinical Impression(s) / ED Diagnoses Final diagnoses:  Flank pain    Rx  / DC Orders ED Discharge Orders     None         Gilford Lardizabal, Annie Main, MD 03/19/22 [id number]

## 2022-04-26 ENCOUNTER — Encounter (HOSPITAL_COMMUNITY): Payer: Self-pay | Admitting: Emergency Medicine

## 2022-04-26 ENCOUNTER — Ambulatory Visit (HOSPITAL_COMMUNITY)
Admission: EM | Admit: 2022-04-26 | Discharge: 2022-04-26 | Disposition: A | Discharge status: Home / Self Care | Payer: Medicaid Other | Attending: Family Medicine | Admitting: Family Medicine

## 2022-04-26 ENCOUNTER — Other Ambulatory Visit: Payer: Self-pay

## 2022-04-26 DIAGNOSIS — R070 Pain in throat: Secondary | ICD-10-CM | POA: Insufficient documentation

## 2022-04-26 DIAGNOSIS — R519 Headache, unspecified: Secondary | ICD-10-CM

## 2022-04-26 DIAGNOSIS — J069 Acute upper respiratory infection, unspecified: Secondary | ICD-10-CM | POA: Diagnosis present

## 2022-04-26 DIAGNOSIS — Z1152 Encounter for screening for COVID-19: Secondary | ICD-10-CM | POA: Diagnosis not present

## 2022-04-26 LAB — POCT RAPID STREP A, ED / UC: Streptococcus, Group A Screen (Direct): NEGATIVE

## 2022-04-26 LAB — RESP PANEL BY RT-PCR (FLU A&B, COVID) ARPGX2
Influenza A by PCR: NEGATIVE
Influenza B by PCR: NEGATIVE
SARS Coronavirus 2 by RT PCR: NEGATIVE

## 2022-04-26 MED ORDER — KETOROLAC TROMETHAMINE 30 MG/ML IJ SOLN
30.0000 mg | Freq: Once | INTRAMUSCULAR | Status: AC
  Administered 2022-04-26: 30 mg via INTRAMUSCULAR

## 2022-04-26 MED ORDER — KETOROLAC TROMETHAMINE 30 MG/ML IJ SOLN
INTRAMUSCULAR | Status: AC
  Filled 2022-04-26: qty 1

## 2022-04-26 MED ORDER — ALBUTEROL SULFATE HFA 108 (90 BASE) MCG/ACT IN AERS: 2.0000 | INHALATION_SPRAY | RESPIRATORY_TRACT | 0 refills | Status: AC | PRN

## 2022-04-26 MED ORDER — ALBUTEROL SULFATE (2.5 MG/3ML) 0.083% IN NEBU: 2.5000 mg | INHALATION_SOLUTION | RESPIRATORY_TRACT | 0 refills | Status: AC | PRN

## 2022-04-26 MED ORDER — TRAMADOL HCL 50 MG PO TABS: 50.0000 mg | ORAL_TABLET | Freq: Four times a day (QID) | ORAL | 0 refills | Status: DC | PRN

## 2022-04-26 NOTE — ED Provider Notes (Addendum)
[place]    CSN: [id number] Arrival date & time: 04/26/22  4098      History   Chief Complaint Chief Complaint  Patient presents with   URI    HPI Hannah Rodriguez is a 41 y.o. female.    URI  Here for sore throat going on about 3 days, since October 30.  Yesterday evening she developed some headache that was pretty intense, she notes pain in her cheek areas bilaterally she is having chills, but has not documented any fever.  She did throw up once this morning and has had some nausea.   She does have a history of asthma and just recently lost her inhaler.  Has a nebulizer at home  Last menstrual cycle was October 23  Past Medical History:  Diagnosis Date   Anxiety    Asthma    Depression     Patient Active Problem List   Diagnosis Date Noted   Normal labor and delivery 10/15/2014   NVD (normal vaginal delivery) 10/15/2014    Past Surgical History:  Procedure Laterality Date   INDUCED ABORTION     NO PAST SURGERIES      OB History     Gravida  9   Para  6   Term  6   Preterm      AB  3   Living  6      SAB  1   IAB  2   Ectopic      Multiple  0   Live Births  6        Obstetric Comments  Termination is confidential;          Home Medications    Prior to Admission medications   Medication Sig Start Date End Date Taking? Authorizing Provider  albuterol (PROVENTIL) (2.5 MG/3ML) 0.083% nebulizer solution Take 3 mLs (2.5 mg total) by nebulization every 4 (four) hours as needed for wheezing or shortness of breath. 04/26/22  Yes Aniruddh Ciavarella, Gwenlyn Perking, MD  traMADol (ULTRAM) 50 MG tablet Take 1 tablet (50 mg total) by mouth every 6 (six) hours as needed (pain). 04/26/22  Yes Barrett Henle, MD  albuterol (VENTOLIN HFA) 108 (90 Base) MCG/ACT inhaler Inhale 2 puffs into the lungs every 4 (four) hours as needed for wheezing or shortness of breath. 04/26/22   Barrett Henle, MD    Family History Family History  Problem  Relation Age of Onset   Heart disease Mother    Heart disease Maternal Grandmother    Cancer Maternal Uncle     Social History Social History   Tobacco Use   Smoking status: Every Day    Packs/day: 1.00    Types: Cigarettes   Smokeless tobacco: Never  Vaping Use   Vaping Use: Never used  Substance Use Topics   Alcohol use: Yes    Comment: socially beer   Drug use: No     Allergies   Codeine and Latex   Review of Systems Review of Systems   Physical Exam Triage Vital Signs ED Triage Vitals  Enc Vitals Group     BP 04/26/22 1054 127/79     Pulse Rate 04/26/22 1054 77     Resp 04/26/22 1054 20     Temp 04/26/22 1054 98.5 F (36.9 C)     Temp Source 04/26/22 1054 Oral     SpO2 04/26/22 1054 96 %     Weight --      Height --  Head Circumference --      Peak Flow --      Pain Score 04/26/22 1052 8     Pain Loc --      Pain Edu? --      Excl. in [place]? --    No data found.  Updated Vital Signs BP 127/79 (BP Location: Left Arm)   Pulse 77   Temp 98.5 F (36.9 C) (Oral)   Resp 20   LMP 04/16/2022   SpO2 96%   Visual Acuity Right Eye Distance:   Left Eye Distance:   Bilateral Distance:    Right Eye Near:   Left Eye Near:    Bilateral Near:     Physical Exam Vitals reviewed.  Constitutional:      General: She is not in acute distress.    Appearance: She is not toxic-appearing.  HENT:     Right Ear: Tympanic membrane and ear canal normal.     Left Ear: Tympanic membrane and ear canal normal.     Nose: Nose normal.     Mouth/Throat:     Mouth: Mucous membranes are moist.     Comments: There is erythema of the oropharynx and tonsillar hypertrophy 1+; clear mucus in the oropharynx Eyes:     Extraocular Movements: Extraocular movements intact.     Conjunctiva/sclera: Conjunctivae normal.     Pupils: Pupils are equal, round, and reactive to light.  Cardiovascular:     Rate and Rhythm: Normal rate and regular rhythm.     Heart sounds: No murmur  heard. Pulmonary:     Effort: Pulmonary effort is normal. No respiratory distress.     Breath sounds: No stridor. No rhonchi or rales.     Comments: Breath sounds are coarse in the lower lobes Chest:     Chest wall: No tenderness.  Musculoskeletal:     Cervical back: Neck supple.  Lymphadenopathy:     Cervical: No cervical adenopathy.  Skin:    Capillary Refill: Capillary refill takes less than 2 seconds.     Coloration: Skin is not jaundiced or pale.  Neurological:     General: No focal deficit present.     Mental Status: She is alert and oriented to person, place, and time.  Psychiatric:        Behavior: Behavior normal.      UC Treatments / Results  Labs (all labs ordered are listed, but only abnormal results are displayed) Labs Reviewed  CULTURE, GROUP A STREP ([place])  RESP PANEL BY RT-PCR (FLU A&B, COVID) [id number]  POCT RAPID STREP A, ED / UC    EKG   Radiology No results found.  Procedures Procedures (including critical care time)  Medications Ordered in UC Medications  ketorolac (TORADOL) 30 MG/ML injection 30 mg (30 mg Intramuscular Given 04/26/22 1127)    Initial Impression / Assessment and Plan / UC Course  I have reviewed the triage vital signs and the nursing notes.  Pertinent labs & imaging results that were available during my care of the patient were reviewed by me and considered in my medical decision making (see chart for details).        Strep is neg; culture sent and we will notify and treat per protocol if positive.  COVID/Flu swab done; If COVID positive, she is a candidate for paxlovid; recent eGFR was >60. She is too late for tamiflu if flu positive.  She states 600 mg ibuprofen had not helped her h/a last night. Toradol given  here and small qty rx of tramadol sent in; OK on pmp.  She states she does not tolerate steroids. Final Clinical Impressions(s) / UC Diagnoses   Final diagnoses:  Viral upper respiratory tract infection   Throat pain     Discharge Instructions      Your strep test is negative.  Culture of the throat will be sent, and staff will notify you if that is in turn positive.   You have been swabbed for COVID and flu, and the test will result in the next 24 hours. Our staff will call you if positive. If the COVID test is positive, you should quarantine for 5 days from the start of your symptoms  Albuterol inhaler--do 2 puffs every 4 hours as needed for shortness of breath or wheezing Or use albuterol in the nebulizer every 4 hours as needed   Take tramadol 50 mg-- 1 tablet every 6 hours as needed for pain.  This medication can make you sleepy or dizzy  You have been given a shot of Toradol 30 mg today.        ED Prescriptions     Medication Sig Dispense Auth. Provider   albuterol (VENTOLIN HFA) 108 (90 Base) MCG/ACT inhaler Inhale 2 puffs into the lungs every 4 (four) hours as needed for wheezing or shortness of breath. 18 g Barrett Henle, MD   albuterol (PROVENTIL) (2.5 MG/3ML) 0.083% nebulizer solution Take 3 mLs (2.5 mg total) by nebulization every 4 (four) hours as needed for wheezing or shortness of breath. 225 mL Barrett Henle, MD   traMADol (ULTRAM) 50 MG tablet Take 1 tablet (50 mg total) by mouth every 6 (six) hours as needed (pain). 8 tablet Vadim Centola, Gwenlyn Perking, MD      I have reviewed the PDMP during this encounter.   Barrett Henle, MD 04/26/22 1159    Barrett Henle, MD 04/26/22 1201

## 2022-04-26 NOTE — ED Triage Notes (Signed)
Has headache, nausea, face pain, head pain, sore throat Symptom onset 2 days ago.   Patient used motrin last night

## 2022-04-26 NOTE — Discharge Instructions (Signed)
Your strep test is negative.  Culture of the throat will be sent, and staff will notify you if that is in turn positive.   You have been swabbed for COVID and flu, and the test will result in the next 24 hours. Our staff will call you if positive. If the COVID test is positive, you should quarantine for 5 days from the start of your symptoms  Albuterol inhaler--do 2 puffs every 4 hours as needed for shortness of breath or wheezing Or use albuterol in the nebulizer every 4 hours as needed   Take tramadol 50 mg-- 1 tablet every 6 hours as needed for pain.  This medication can make you sleepy or dizzy  You have been given a shot of Toradol 30 mg today.

## 2022-04-28 LAB — CULTURE, GROUP A STREP (THRC)

## 2022-05-28 ENCOUNTER — Ambulatory Visit (INDEPENDENT_AMBULATORY_CARE_PROVIDER_SITE_OTHER): Payer: Medicaid Other

## 2022-05-28 ENCOUNTER — Ambulatory Visit: Payer: Medicaid Other | Admitting: Podiatry

## 2022-05-28 ENCOUNTER — Encounter (HOSPITAL_COMMUNITY): Payer: Self-pay | Admitting: *Deleted

## 2022-05-28 ENCOUNTER — Ambulatory Visit (HOSPITAL_COMMUNITY)
Admission: EM | Admit: 2022-05-28 | Discharge: 2022-05-28 | Disposition: A | Discharge status: Home / Self Care | Payer: Medicaid Other | Attending: Internal Medicine | Admitting: Internal Medicine

## 2022-05-28 DIAGNOSIS — B070 Plantar wart: Secondary | ICD-10-CM

## 2022-05-28 DIAGNOSIS — M79671 Pain in right foot: Secondary | ICD-10-CM

## 2022-05-28 DIAGNOSIS — S99921A Unspecified injury of right foot, initial encounter: Secondary | ICD-10-CM | POA: Diagnosis not present

## 2022-05-28 NOTE — Progress Notes (Signed)
  Subjective:  Patient ID: Hannah Rodriguez, female    DOB: Sep 17, 1980,  MRN: [id number]  Chief Complaint  Patient presents with   Foot Problem    right foot injured a few months ago now has a spot (urgent care stated plantar wart)    41 y.o. female presents with concern for lesion on the bottom of her right foot at the ball of the first MPJ.  She says that she injured her foot a few months ago thought that she had a foreign body possibly a branch of a plant going to her foot and that started this lesion to occur.  She has tried to shave it down and has significant pain when pressing on it.  She denies any open wound or drainage from the area.  She is having trouble walking on the foot due to the pain caused by this lesion.  Has not tried any treatments for it.  Past Medical History:  Diagnosis Date   Anxiety    Asthma    Depression     Allergies  Allergen Reactions   Codeine Nausea And Vomiting   Latex Other (See Comments)    Vaginal sensitivity    ROS: Negative except as per HPI above  Objective:  General: AAO x3, NAD  Dermatological: Directed to the plantar aspect of the first MPJ on the right foot there is 1/2 cm to 1 cm raised lesion with hyperkeratotic callus and black pinpoint lesions with pinpoint bleeding upon debridement consistent with a type I verruca plantaris.  Vascular:  Dorsalis Pedis artery and Posterior Tibial artery pedal pulses are 2/4 bilateral.  Capillary fill time < 3 sec to all digits.   Neruologic: Grossly intact via light touch bilateral. Protective threshold intact to all sites bilateral.   Musculoskeletal: No gross boney pedal deformities bilateral. No pain, crepitus, or limitation noted with foot and ankle range of motion bilateral. Muscular strength 5/5 in all groups tested bilateral.  Gait: Unassisted, Nonantalgic.   No images are attached to the encounter.  Assessment:   1. Plantar wart of right foot      Plan:  Patient was evaluated and  treated and all questions answered.  Discussed etiology and treatment of verruca plantaris in detail with the patient as well as multiple treatment options including blistering agents, chemotherapeutic agents, surgical excision, laser therapy and the indications and roles of the above.  Today, recommended treatment with Cantharone as noted in procedure note below.  Follow-up in 3 weeks for reevaluation  Procedure: Destruction of Lesion Location: Plantar first metatarsal phalangeal joint right foot x1 lesion Instrumentation: 15 blade. Technique: Debridement of lesion to petechial bleeding. Aperture pad applied around lesion. Small amount of canthrone applied to the base of the lesion. Dressing: Dry, sterile, compression dressing. Disposition: Patient tolerated procedure well. Advised to leave dressing on for 6-8 hours. Thereafter patient to wash the area with soap and water and applied band-aid. Off-loading pads dispensed. Patient to return in 3 weeks for follow-up.   Return in about 3 weeks (around 06/18/2022) for Follow-up right plantar wart.          Corinna Gab, DPM [facility] / [facility]

## 2022-05-28 NOTE — Patient Instructions (Signed)
Take dressing off in 8 hours and wash the foot with soap and water. If it is hurting or becomes uncomfortable before the 8 hours, go ahead and remove the bandage and wash the area.  If it blisters, apply antibiotic ointment and a band-aid.  Monitor for any signs/symptoms of infection. Call the office immediately if any occur or go directly to the emergency room. Call with any questions/concerns.   

## 2022-05-28 NOTE — ED Provider Notes (Signed)
[facility]    CSN: [id number] Arrival date & time: 05/28/22  0813      History   Chief Complaint Chief Complaint  Patient presents with   Foot Injury    HPI Hannah Rodriguez is a 41 y.o. female.   Patient presents urgent care for evaluation of wound to the plantar aspect of the right foot that started approximately 3 months ago after she believes she stepped on a tree branch and got a piece of wood in the right foot.  Over the last 3 months, the area has become more swollen, painful, and raised.  Patient has been using a clean razor blade to the area to scrape off the top parts of the skin that are causing her significant discomfort with walking.  She states that when she does this, she is able to relieve some of the pressure causing pain with walking.  Denies numbness and tingling to bilateral lower extremities.  There is no drainage from the site or underlying soft tissue swelling.  No recent falls or trauma to the area.     Past Medical History:  Diagnosis Date   Anxiety    Asthma    Depression     Patient Active Problem List   Diagnosis Date Noted   Normal labor and delivery 10/15/2014   NVD (normal vaginal delivery) 10/15/2014    Past Surgical History:  Procedure Laterality Date   INDUCED ABORTION     NO PAST SURGERIES      OB History     Gravida  9   Para  6   Term  6   Preterm      AB  3   Living  6      SAB  1   IAB  2   Ectopic      Multiple  0   Live Births  6        Obstetric Comments  Termination is confidential;          Home Medications    Prior to Admission medications   Medication Sig Start Date End Date Taking? Authorizing Provider  albuterol (PROVENTIL) (2.5 MG/3ML) 0.083% nebulizer solution Take 3 mLs (2.5 mg total) by nebulization every 4 (four) hours as needed for wheezing or shortness of breath. 04/26/22  Yes Zenia Resides, MD  albuterol (VENTOLIN HFA) 108 (90 Base) MCG/ACT inhaler Inhale 2  puffs into the lungs every 4 (four) hours as needed for wheezing or shortness of breath. 04/26/22  Yes Banister, Janace Aris, MD  traMADol (ULTRAM) 50 MG tablet Take 1 tablet (50 mg total) by mouth every 6 (six) hours as needed (pain). 04/26/22   Zenia Resides, MD    Family History Family History  Problem Relation Age of Onset   Heart disease Mother    Heart disease Maternal Grandmother    Cancer Maternal Uncle     Social History Social History   Tobacco Use   Smoking status: Every Day    Packs/day: 1.00    Types: Cigarettes   Smokeless tobacco: Never  Vaping Use   Vaping Use: Never used  Substance Use Topics   Alcohol use: Yes    Comment: socially beer   Drug use: No     Allergies   Codeine and Latex   Review of Systems Review of Systems Per HPI  Physical Exam Triage Vital Signs ED Triage Vitals  Enc Vitals Group     BP 05/28/22 0920  119/84     Pulse Rate 05/28/22 0920 71     Resp 05/28/22 0920 18     Temp 05/28/22 0920 97.7 F (36.5 C)     Temp Source 05/28/22 0920 Oral     SpO2 05/28/22 0920 98 %     Weight --      Height --      Head Circumference --      Peak Flow --      Pain Score 05/28/22 0917 9     Pain Loc --      Pain Edu? --      Excl. in [place]? --    No data found.  Updated Vital Signs BP 119/84 (BP Location: Left Arm)   Pulse 71   Temp 97.7 F (36.5 C) (Oral)   Resp 18   LMP 04/30/2022 (Approximate)   SpO2 98%   Visual Acuity Right Eye Distance:   Left Eye Distance:   Bilateral Distance:    Right Eye Near:   Left Eye Near:    Bilateral Near:     Physical Exam Vitals and nursing note reviewed.  Constitutional:      Appearance: She is not ill-appearing or toxic-appearing.  HENT:     Head: Normocephalic and atraumatic.     Right Ear: Hearing and external ear normal.     Left Ear: Hearing and external ear normal.     Nose: Nose normal.     Mouth/Throat:     Lips: Pink.  Eyes:     General: Lids are normal. Vision grossly  intact. Gaze aligned appropriately.     Extraocular Movements: Extraocular movements intact.     Conjunctiva/sclera: Conjunctivae normal.  Pulmonary:     Effort: Pulmonary effort is normal.  Musculoskeletal:     Cervical back: Neck supple.  Skin:    General: Skin is warm and dry.     Capillary Refill: Capillary refill takes less than 2 seconds.     Findings: No rash.     Comments: Large plantar wart present to the plantar aspect of the right foot as seen in image below.  Area is nonfluctuant and is very tender to palpation.  No underlying soft tissue swelling, erythema, or warmth.   Neurological:     General: No focal deficit present.     Mental Status: She is alert and oriented to person, place, and time. Mental status is at baseline.     Cranial Nerves: No dysarthria or facial asymmetry.  Psychiatric:        Mood and Affect: Mood normal.        Speech: Speech normal.        Behavior: Behavior normal.        Thought Content: Thought content normal.        Judgment: Judgment normal.      UC Treatments / Results  Labs (all labs ordered are listed, but only abnormal results are displayed) Labs Reviewed - No data to display  EKG   Radiology DG Foot Complete Right  Result Date: 05/28/2022 CLINICAL DATA:  Stepped on a piece of wood 3 months ago, pain and swelling question foreign body EXAM: RIGHT FOOT COMPLETE - 3+ VIEW COMPARISON:  None Available. FINDINGS: Osseous mineralization normal. Small plantar calcaneal spur. No acute fracture, dislocation, or bone destruction. No radiopaque foreign bodies or soft tissue gas. IMPRESSION: Small plantar calcaneal spur. No radiopaque foreign bodies identified. Please note that wood is typically a radiolucent material, not radiopaque. Electronically  Signed   By: Ulyses Southward M.D.   On: 05/28/2022 09:51    Procedures Procedures (including critical care time)  Medications Ordered in UC Medications - No data to display  Initial Impression /  Assessment and Plan / UC Course  I have reviewed the triage vital signs and the nursing notes.  Pertinent labs & imaging results that were available during my care of the patient were reviewed by me and considered in my medical decision making (see chart for details).   1.  Plantar wart of right foot, injury of right foot Difficult to distinguish small abscess to the area versus plantar wart.  Plantar wart is more likely and patient may purchase moleskin or Dr. Margart Sickles foot pads to wear to the area to relieve pressure when walking.  Imaging is negative for foreign body, although wound is not radiopaque.  Low suspicion for infection based on history and exam findings.  Walking referral to [facility] provided.  Patient to call them and schedule an appointment for follow-up and further evaluation.  She is agreeable with this plan.  May use Tylenol/ibuprofen as needed for foot pain.  Discussed physical exam and available lab work findings in clinic with patient.  Counseled patient regarding appropriate use of medications and potential side effects for all medications recommended or prescribed today. Discussed red flag signs and symptoms of worsening condition,when to call the PCP office, return to urgent care, and when to seek higher level of care in the emergency department. Patient verbalizes understanding and agreement with plan. All questions answered. Patient discharged in stable condition.    Final Clinical Impressions(s) / UC Diagnoses   Final diagnoses:  Plantar wart of right foot  Injury of right foot, initial encounter     Discharge Instructions      Your foot lesion is likely a large plantar wart.  I would like you to go to [facility] to have this evaluated further. Their information is below:  [phone]  [address] [place], [place], [place] [address] [place] [facility] at [facility]  In the meantime, purchase moleskin or Dr. Margart Sickles pads to  place to the surrounding skin of the wart to relieve pressure on the lesion when you are walking.   You may continue taking tylenol/ibuprofen as needed for pain.  If you develop any new or worsening symptoms or do not improve in the next 2 to 3 days, please return.  If your symptoms are severe, please go to the emergency room.  Follow-up with your primary care provider for further evaluation and management of your symptoms as well as ongoing wellness visits.  I hope you feel better!    ED Prescriptions   None    PDMP not reviewed this encounter.   Carlisle Beers, [place] 05/28/22 1054

## 2022-05-28 NOTE — ED Triage Notes (Signed)
Pt states she stepped on a piece of wood like 3 months ago on her right foot. Its causing pain and swelling by the end of the day. She is shaving off the top layers every so often.

## 2022-05-28 NOTE — Discharge Instructions (Signed)
Your foot lesion is likely a large plantar wart.  I would like you to go to [facility] to have this evaluated further. Their information is below:  [phone]  [address] [place], [place], [place] [address] [place] [facility] at [facility]  In the meantime, purchase moleskin or Dr. Margart Sickles pads to place to the surrounding skin of the wart to relieve pressure on the lesion when you are walking.   You may continue taking tylenol/ibuprofen as needed for pain.  If you develop any new or worsening symptoms or do not improve in the next 2 to 3 days, please return.  If your symptoms are severe, please go to the emergency room.  Follow-up with your primary care provider for further evaluation and management of your symptoms as well as ongoing wellness visits.  I hope you feel better!

## 2022-06-22 ENCOUNTER — Ambulatory Visit: Payer: Medicaid Other | Admitting: Podiatry

## 2022-06-22 DIAGNOSIS — B070 Plantar wart: Secondary | ICD-10-CM

## 2022-06-22 NOTE — Progress Notes (Signed)
  Subjective:  Patient ID: Hannah Rodriguez, female    DOB: 1981/03/23,  MRN: [id number]  Chief Complaint  Patient presents with   Plantar Warts    Right plantar wart.    41 y.o. female presents with concern for lesion on the bottom of her right foot at the ball of the first MPJ.  Has been treated with 1 round of Cantharone on the lesion.  Says there was not much blistering after the first application.  Still having pain with pressure on the area.  Past Medical History:  Diagnosis Date   Anxiety    Asthma    Depression     Allergies  Allergen Reactions   Codeine Nausea And Vomiting   Latex Other (See Comments)    Vaginal sensitivity    ROS: Negative except as per HPI above  Objective:  General: AAO x3, NAD  Dermatological: Directed to the plantar aspect of the first MPJ on the right foot there is 1/2 cm to 1 cm raised lesion with hyperkeratotic callus and black pinpoint lesions with pinpoint bleeding upon debridement consistent with a type I verruca plantaris.  Vascular:  Dorsalis Pedis artery and Posterior Tibial artery pedal pulses are 2/4 bilateral.  Capillary fill time < 3 sec to all digits.   Neruologic: Grossly intact via light touch bilateral. Protective threshold intact to all sites bilateral.   Musculoskeletal: No gross boney pedal deformities bilateral. No pain, crepitus, or limitation noted with foot and ankle range of motion bilateral. Muscular strength 5/5 in all groups tested bilateral.  Gait: Unassisted, Nonantalgic.   No images are attached to the encounter.  Assessment:   1. Plantar wart of right foot      Plan:  Patient was evaluated and treated and all questions answered.  #Plantar wart of R foot Discussed etiology and treatment of verruca plantaris in detail with the patient as well as multiple treatment options including blistering agents, chemotherapeutic agents, surgical excision, laser therapy and the indications and roles of the above.   Today, recommended treatment with Cantharone as noted in procedure note below.  Follow-up in 3 weeks for reevaluation  Procedure: Destruction of Lesion Location: Plantar first metatarsal phalangeal joint right foot x1 lesion Instrumentation: 15 blade. Technique: Debridement of lesion to petechial bleeding. Aperture pad applied around lesion. Small amount of canthrone applied to the base of the lesion. Dressing: Dry, sterile, compression dressing. Disposition: Patient tolerated procedure well. Advised to leave dressing on for 6-8 hours. Thereafter patient to wash the area with soap and water and applied band-aid. Off-loading pads dispensed. Patient to return in 3 weeks for follow-up.   Return in about 3 weeks (around 07/13/2022) for F/u R foot wart.          Corinna Gab, DPM [facility] / [facility]

## 2022-07-16 ENCOUNTER — Ambulatory Visit (INDEPENDENT_AMBULATORY_CARE_PROVIDER_SITE_OTHER): Payer: Medicaid Other | Admitting: Podiatry

## 2022-07-16 DIAGNOSIS — Z91199 Patient's noncompliance with other medical treatment and regimen due to unspecified reason: Secondary | ICD-10-CM

## 2022-07-16 NOTE — Progress Notes (Signed)
Pt is a no show for apt, no charge

## 2022-08-15 ENCOUNTER — Other Ambulatory Visit: Payer: Self-pay | Admitting: Internal Medicine

## 2022-08-15 DIAGNOSIS — Z1231 Encounter for screening mammogram for malignant neoplasm of breast: Secondary | ICD-10-CM

## 2022-09-27 ENCOUNTER — Ambulatory Visit
Admission: RE | Admit: 2022-09-27 | Discharge: 2022-09-27 | Disposition: A | Discharge status: Home / Self Care | Payer: Medicaid Other | Source: Ambulatory Visit | Attending: Internal Medicine | Admitting: Internal Medicine

## 2022-09-27 DIAGNOSIS — Z1231 Encounter for screening mammogram for malignant neoplasm of breast: Secondary | ICD-10-CM

## 2022-10-10 ENCOUNTER — Ambulatory Visit (HOSPITAL_COMMUNITY): Payer: Self-pay

## 2022-10-11 ENCOUNTER — Encounter (HOSPITAL_COMMUNITY): Payer: Self-pay

## 2022-10-11 ENCOUNTER — Ambulatory Visit (HOSPITAL_COMMUNITY)
Admission: RE | Admit: 2022-10-11 | Discharge: 2022-10-11 | Disposition: A | Discharge status: Home / Self Care | Payer: Medicaid Other | Source: Ambulatory Visit

## 2022-10-11 ENCOUNTER — Ambulatory Visit (INDEPENDENT_AMBULATORY_CARE_PROVIDER_SITE_OTHER): Payer: Medicaid Other

## 2022-10-11 VITALS — BP 134/93 | HR 89 | Temp 99.2°F | Resp 18

## 2022-10-11 DIAGNOSIS — M79645 Pain in left finger(s): Secondary | ICD-10-CM | POA: Diagnosis not present

## 2022-10-11 DIAGNOSIS — S93491A Sprain of other ligament of right ankle, initial encounter: Secondary | ICD-10-CM | POA: Diagnosis not present

## 2022-10-11 DIAGNOSIS — S6982XA Other specified injuries of left wrist, hand and finger(s), initial encounter: Secondary | ICD-10-CM | POA: Diagnosis not present

## 2022-10-11 DIAGNOSIS — M791 Myalgia, unspecified site: Secondary | ICD-10-CM

## 2022-10-11 MED ORDER — TIZANIDINE HCL 4 MG PO TABS: 4.0000 mg | ORAL_TABLET | Freq: Three times a day (TID) | ORAL | 0 refills | Status: DC | PRN

## 2022-10-11 MED ORDER — SULINDAC 200 MG PO TABS: 200.0000 mg | ORAL_TABLET | Freq: Two times a day (BID) | ORAL | 0 refills | Status: AC

## 2022-10-11 NOTE — ED Triage Notes (Signed)
Pt states injured her lt ring finger 3 weeks ago getting it stuck in a dog leash. Wearing a finger splint. Taking ibuprofen with no relief.   Pt states tripped and fell on Tuesday on her porch and her rt ankle buckled and hurt her back. C/o rt foot pain radiating up leg and upper/lower back pain.

## 2022-10-11 NOTE — Discharge Instructions (Addendum)
Your xray does not show any acute fracture. You have a hyperextension injury. Please continue wearing the finger brace.  You have a muscular cause of your back pain. Please stop taking over the counter NSAIDs (such as ibuprofen, naproxen or motrin) and start the medication called in today. I have also called in a muscle relaxer to help with your upper back pain. This might make you tired or sleepy, so take with caution and do not operate machinery after taking.  Apply a moist heat to your upper back and get a massage.  Please wear the ankle brace for the next three weeks. Please perform the home ankle exercises attached to this form.

## 2022-10-11 NOTE — ED Provider Notes (Signed)
[facility]    CSN: [id number] Arrival date & time: 10/11/22  1532      History   Chief Complaint Chief Complaint  Patient presents with   Back Pain    Finger, foot, leg, and back pain - Entered by patient   Finger Injury   Fall    HPI SENECA GADBOIS is a 42 y.o. female.   Pleasant 42 year old female presents today with multiple orthopedic complaints. She states 3 weeks ago, she was holding her dog leash when another dog came into her yard.  Her dog became startled and yanked on the leash, hyperextending patient's left ring finger.  Patient states it was extremely swollen, but has decreased in size.  She reports significant pain and bruising still around the PIP joint.  Patient brought a finger splint from [employer] and has been using this and ibuprofen with minimal relief.  Additionally, 2 nights ago, patient walked out on her back porch.  She states there is a small hole in the wood, and caused her to invert her right ankle.  After the inversion injury, she started falling, but stretched out her arms and caught herself from falling forward and landed on her back.  Since the fall, patient states lower back pain, and muscle tension to her upper trap region.   Back Pain Fall    Past Medical History:  Diagnosis Date   Anxiety    Asthma    Depression     Patient Active Problem List   Diagnosis Date Noted   Normal labor and delivery 10/15/2014   NVD (normal vaginal delivery) 10/15/2014    Past Surgical History:  Procedure Laterality Date   INDUCED ABORTION     NO PAST SURGERIES      OB History     Gravida  9   Para  6   Term  6   Preterm      AB  3   Living  6      SAB  1   IAB  2   Ectopic      Multiple  0   Live Births  6        Obstetric Comments  Termination is confidential;          Home Medications    Prior to Admission medications   Medication Sig Start Date End Date Taking? Authorizing Provider  ALPRAZolam  Prudy Feeler) 0.5 MG tablet Take 0.5 mg by mouth every 6 (six) hours as needed. 08/03/22  Yes [provider]  sulindac (CLINORIL) 200 MG tablet Take 1 tablet (200 mg total) by mouth 2 (two) times daily with a meal for 7 days. 10/11/22 10/18/22 Yes Adelayde Minney L, PA  tiZANidine (ZANAFLEX) 4 MG tablet Take 1 tablet (4 mg total) by mouth every 8 (eight) hours as needed for muscle spasms. 10/11/22  Yes Bilan Tedesco L, PA  albuterol (PROVENTIL) (2.5 MG/3ML) 0.083% nebulizer solution Take 3 mLs (2.5 mg total) by nebulization every 4 (four) hours as needed for wheezing or shortness of breath. 04/26/22   Zenia Resides, MD  albuterol (VENTOLIN HFA) 108 (90 Base) MCG/ACT inhaler Inhale 2 puffs into the lungs every 4 (four) hours as needed for wheezing or shortness of breath. 04/26/22   Zenia Resides, MD    Family History Family History  Problem Relation Age of Onset   Heart disease Mother    Heart disease Maternal Grandmother    Cancer Maternal Uncle     Social  History Social History   Tobacco Use   Smoking status: Every Day    Packs/day: 1    Types: Cigarettes   Smokeless tobacco: Never  Vaping Use   Vaping Use: Never used  Substance Use Topics   Alcohol use: Yes    Comment: socially beer   Drug use: No     Allergies   Codeine and Latex   Review of Systems Review of Systems  Musculoskeletal:  Positive for back pain.  As per HPI   Physical Exam Triage Vital Signs ED Triage Vitals [10/11/22 1633]  Enc Vitals Group     BP (!) 134/93     Pulse Rate 89     Resp 18     Temp 99.2 F (37.3 C)     Temp Source Oral     SpO2 97 %     Weight      Height      Head Circumference      Peak Flow      Pain Score 6     Pain Loc      Pain Edu?      Excl. in GC?    No data found.  Updated Vital Signs BP (!) 134/93 (BP Location: Left Arm)   Pulse 89   Temp 99.2 F (37.3 C) (Oral)   Resp 18   LMP 10/01/2022   SpO2 97%   Breastfeeding No   Visual Acuity Right  Eye Distance:   Left Eye Distance:   Bilateral Distance:    Right Eye Near:   Left Eye Near:    Bilateral Near:     Physical Exam Vitals and nursing note reviewed.  Constitutional:      General: She is not in acute distress.    Appearance: Normal appearance. She is not ill-appearing, toxic-appearing or diaphoretic.  HENT:     Head: Normocephalic and atraumatic.  Cardiovascular:     Rate and Rhythm: Normal rate.  Pulmonary:     Effort: Pulmonary effort is normal. No respiratory distress.     Breath sounds: Normal breath sounds.  Musculoskeletal:     Right wrist: Normal. No swelling, tenderness or bony tenderness. Normal range of motion.     Left wrist: Normal. No swelling, tenderness or bony tenderness. Normal range of motion.     Right hand: Normal. No swelling, deformity, lacerations, tenderness or bony tenderness. Normal range of motion. Normal capillary refill. Normal pulse.     Left hand: Swelling, tenderness and bony tenderness present. Decreased range of motion (decreased due to pain/swelling, however movement noted to both DIP and PIP). Normal capillary refill. Normal pulse.       Hands:     Cervical back: Tenderness (tense, tender knots noted to bilateral traps) present. No swelling, deformity, erythema, lacerations or bony tenderness.     Thoracic back: Normal. No swelling, edema, deformity, signs of trauma, lacerations, spasms, tenderness or bony tenderness. Normal range of motion.     Lumbar back: Spasms and tenderness present. No swelling, edema, deformity, signs of trauma or bony tenderness. Normal range of motion. Negative right straight leg raise test and negative left straight leg raise test.       Back:     Comments: Swelling to L 4th digit, bruising noted to PIP joint  Neurological:     Mental Status: She is alert.      UC Treatments / Results  Labs (all labs ordered are listed, but only abnormal results are displayed) Labs Reviewed -  No data to  display  EKG   Radiology DG Finger Ring Left  Result Date: 10/11/2022 CLINICAL DATA:  Trauma to the left fourth digit. EXAM: LEFT RING FINGER 2+V COMPARISON:  None Available. FINDINGS: There is no evidence of fracture or dislocation. There is no evidence of arthropathy or other focal bone abnormality. Soft tissues are unremarkable. IMPRESSION: Negative. Electronically Signed   By: Elgie Collard M.D.   On: 10/11/2022 17:12    Procedures Procedures (including critical care time)  Medications Ordered in UC Medications - No data to display  Initial Impression / Assessment and Plan / UC Course  I have reviewed the triage vital signs and the nursing notes.  Pertinent labs & imaging results that were available during my care of the patient were reviewed by me and considered in my medical decision making (see chart for details).     Hyperextension injury of L ring finger - xray negative for acute fracture. Continue splint, ice and NSAIDs Muscle tension pain - moist heat, massage, tizanidine for the upper traps Sprain R ankle - per [place] ankle rule, no indication for xray. Pt fully ambulatory. Will place in ankle stabilization to prevent repeat injury. Home exercises discussed. Pt to ice and use sulindac BID with food.   Final Clinical Impressions(s) / UC Diagnoses   Final diagnoses:  Hyperextension injury of finger of left hand  Muscle tension pain  Sprain of other ligament of right ankle, initial encounter     Discharge Instructions      Your xray does not show any acute fracture. You have a hyperextension injury. Please continue wearing the finger brace.  You have a muscular cause of your back pain. Please stop taking over the counter NSAIDs (such as ibuprofen, naproxen or motrin) and start the medication called in today. I have also called in a muscle relaxer to help with your upper back pain. This might make you tired or sleepy, so take with caution and do not operate  machinery after taking.  Apply a moist heat to your upper back and get a massage.  Please wear the ankle brace for the next three weeks. Please perform the home ankle exercises attached to this form.      ED Prescriptions     Medication Sig Dispense Auth. Provider   sulindac (CLINORIL) 200 MG tablet Take 1 tablet (200 mg total) by mouth 2 (two) times daily with a meal for 7 days. 14 tablet Jairen Goldfarb L, PA   tiZANidine (ZANAFLEX) 4 MG tablet Take 1 tablet (4 mg total) by mouth every 8 (eight) hours as needed for muscle spasms. 30 tablet Tricia Oaxaca L, [place]      PDMP not reviewed this encounter.   Maretta Bees, [place] 10/11/22 1805

## 2023-03-09 ENCOUNTER — Other Ambulatory Visit: Payer: Self-pay

## 2023-03-09 ENCOUNTER — Emergency Department (HOSPITAL_BASED_OUTPATIENT_CLINIC_OR_DEPARTMENT_OTHER)
Admission: EM | Admit: 2023-03-09 | Discharge: 2023-03-09 | Disposition: A | Discharge status: Home / Self Care | Payer: Medicaid Other | Attending: Emergency Medicine | Admitting: Emergency Medicine

## 2023-03-09 ENCOUNTER — Encounter (HOSPITAL_BASED_OUTPATIENT_CLINIC_OR_DEPARTMENT_OTHER): Payer: Self-pay

## 2023-03-09 ENCOUNTER — Other Ambulatory Visit (HOSPITAL_BASED_OUTPATIENT_CLINIC_OR_DEPARTMENT_OTHER): Payer: Self-pay

## 2023-03-09 DIAGNOSIS — L98491 Non-pressure chronic ulcer of skin of other sites limited to breakdown of skin: Secondary | ICD-10-CM | POA: Diagnosis not present

## 2023-03-09 DIAGNOSIS — Z91040 Latex allergy status: Secondary | ICD-10-CM | POA: Diagnosis not present

## 2023-03-09 DIAGNOSIS — Z1152 Encounter for screening for COVID-19: Secondary | ICD-10-CM | POA: Insufficient documentation

## 2023-03-09 DIAGNOSIS — J069 Acute upper respiratory infection, unspecified: Secondary | ICD-10-CM

## 2023-03-09 DIAGNOSIS — L304 Erythema intertrigo: Secondary | ICD-10-CM | POA: Diagnosis not present

## 2023-03-09 DIAGNOSIS — R059 Cough, unspecified: Secondary | ICD-10-CM | POA: Diagnosis present

## 2023-03-09 HISTORY — DX: Migraine, unspecified, not intractable, without status migrainosus: G43.909

## 2023-03-09 HISTORY — DX: Other intervertebral disc degeneration, lumbar region without mention of lumbar back pain or lower extremity pain: M51.369

## 2023-03-09 LAB — RESP PANEL BY RT-PCR (RSV, FLU A&B, COVID)  RVPGX2
Influenza A by PCR: NEGATIVE
Influenza B by PCR: NEGATIVE
Resp Syncytial Virus by PCR: NEGATIVE
SARS Coronavirus 2 by RT PCR: NEGATIVE

## 2023-03-09 MED ORDER — CLOTRIMAZOLE 1 % EX CREA
TOPICAL_CREAM | CUTANEOUS | 1 refills | Status: DC
  Filled 2023-03-09: qty 14, 7d supply, fill #0

## 2023-03-09 MED ORDER — ALBUTEROL SULFATE HFA 108 (90 BASE) MCG/ACT IN AERS
2.0000 | INHALATION_SPRAY | RESPIRATORY_TRACT | Status: DC | PRN
  Administered 2023-03-09: 2 via RESPIRATORY_TRACT
  Filled 2023-03-09: qty 6.7

## 2023-03-09 NOTE — Discharge Instructions (Signed)
Please read and follow all provided instructions.  Your diagnoses today include:  1. Upper respiratory tract infection, unspecified type   2. Intertrigo   3. Skin ulcer, limited to breakdown of skin (HCC)     You appear to have an upper respiratory infection (URI). An upper respiratory tract infection, or cold, is a viral infection of the air passages leading to the lungs. It should improve gradually after 5-7 days. You may have a lingering cough that lasts for 2- 4 weeks after the infection.  Tests performed today include: Vital signs. See below for your results today.   Medications prescribed:  Albuterol inhaler - medication that opens up your airway  Use inhaler as follows: 1-2 puffs with spacer every 4 hours as needed for wheezing, cough, or shortness of breath.   Clotrimazole -topical medication for fungal infection  Take any prescribed medications only as directed. Treatment for your infection is aimed at treating the symptoms. There are no medications, such as antibiotics, that will cure your infection.   Home care instructions:  You can take Tylenol and/or Ibuprofen as directed on the packaging for fever reduction and pain relief.    For cough: honey 1/2 to 1 teaspoon (you can dilute the honey in water or another fluid).  You can also use guaifenesin and dextromethorphan for cough. You can use a humidifier for chest congestion and cough.  If you don't have a humidifier, you can sit in the bathroom with the hot shower running.      For sore throat: try warm salt water gargles, cepacol lozenges, throat spray, warm tea or water with lemon/honey, popsicles or ice, or OTC cold relief medicine for throat discomfort.    For congestion: take a daily anti-histamine like Zyrtec, Claritin, and a oral decongestant, such as pseudoephedrine.  You can also use Flonase 1-2 sprays in each nostril daily.    It is important to stay hydrated: drink plenty of fluids (water,  gatorade/powerade/pedialyte, juices, or teas) to keep your throat moisturized and help further relieve irritation/discomfort.   Your illness is contagious and can be spread to others, especially during the first 3 or 4 days. It cannot be cured by antibiotics or other medicines. Take basic precautions such as washing your hands often, covering your mouth when you cough or sneeze, and avoiding public places where you could spread your illness to others.   Please continue drinking plenty of fluids.  Use over-the-counter medicines as needed as directed on packaging for symptom relief.  You may also use ibuprofen or tylenol as directed on packaging for pain or fever.  Do not take multiple medicines containing Tylenol or acetaminophen to avoid taking too much of this medication.  Follow-up instructions: Please follow-up with your primary care provider in the next 3 days for further evaluation of your symptoms if you are not feeling better.   Return instructions:  Please return to the Emergency Department if you experience worsening symptoms.  Return if the wound becomes more red, swollen, has pus draining, or more painful for recheck RETURN IMMEDIATELY IF you develop shortness of breath, confusion or altered mental status, a new rash, become dizzy, faint, or poorly responsive, or are unable to be cared for at home. Please return if you have persistent vomiting and cannot keep down fluids or develop a fever that is not controlled by tylenol or motrin.   Please return if you have any other emergent concerns.  Additional Information:  Your vital signs today were: BP Marland Kitchen)  140/82   Pulse 87   Temp 98.2 F (36.8 C) (Oral)   Resp 16   Ht 5' (1.524 m)   Wt 88.5 kg   SpO2 99%   BMI 38.08 kg/m  If your blood pressure (BP) was elevated above 135/85 this visit, please have this repeated by your doctor within one month. --------------

## 2023-03-09 NOTE — ED Provider Notes (Signed)
[place] EMERGENCY DEPARTMENT AT [facility] Provider Note   CSN: [id number] Arrival date & time: 03/09/23  4098     History  No chief complaint on file.   Hannah Rodriguez is a 42 y.o. female.  Patient with history of asthma presents to the emergency department for evaluation of URI symptoms as well as a rash underneath her breast.  URI symptoms started yesterday.  She has had runny nose, nasal congestion, sore throat and cough.  She has not checked her temperature but has had some subjective chills.  Cough is nonproductive.  No shortness of breath.  No known sick contacts.  She has had nausea without vomiting.  She is also had some diarrhea.  No urinary symptoms.  She does not currently have albuterol at home.  Patient also noted a small area underneath her right breast.  [place] and was holding onto a part of a throw ride.  She states that she was repeatedly knocked in the area and thought initially that she had a bruise underneath her breast.  She looked when she got home and noted a small area of skin breakdown and redness.  No history of diabetes.  She is concerned about MRSA.       Home Medications Prior to Admission medications   Medication Sig Start Date End Date Taking? Authorizing Provider  ALPRAZolam Prudy Feeler) 0.5 MG tablet Take 0.5 mg by mouth every 6 (six) hours as needed. 08/03/22  Yes [provider]  albuterol (PROVENTIL) (2.5 MG/3ML) 0.083% nebulizer solution Take 3 mLs (2.5 mg total) by nebulization every 4 (four) hours as needed for wheezing or shortness of breath. 04/26/22   Zenia Resides, MD  albuterol (VENTOLIN HFA) 108 (90 Base) MCG/ACT inhaler Inhale 2 puffs into the lungs every 4 (four) hours as needed for wheezing or shortness of breath. 04/26/22   Zenia Resides, MD  tiZANidine (ZANAFLEX) 4 MG tablet Take 1 tablet (4 mg total) by mouth every 8 (eight) hours as needed for muscle spasms. 10/11/22   Guy Sandifer L, PA       Allergies    Codeine and Latex    Review of Systems   Review of Systems  Physical Exam Updated Vital Signs BP (!) 140/82   Pulse 87   Temp 98.2 F (36.8 C) (Oral)   Resp 16   Ht 5' (1.524 m)   Wt 88.5 kg   SpO2 99%   BMI 38.08 kg/m  Physical Exam Vitals and nursing note reviewed.  Constitutional:      Appearance: She is well-developed.  HENT:     Head: Normocephalic and atraumatic.     Jaw: No trismus.     Right Ear: Tympanic membrane, ear canal and external ear normal.     Left Ear: Tympanic membrane, ear canal and external ear normal.     Nose: Congestion present. No mucosal edema or rhinorrhea.     Mouth/Throat:     Mouth: Mucous membranes are moist. Mucous membranes are not dry. No oral lesions.     Pharynx: Uvula midline. No oropharyngeal exudate, posterior oropharyngeal erythema or uvula swelling.     Tonsils: No tonsillar abscesses.  Eyes:     General:        Right eye: No discharge.        Left eye: No discharge.     Conjunctiva/sclera: Conjunctivae normal.  Cardiovascular:     Rate and Rhythm: Normal rate and  regular rhythm.     Heart sounds: Normal heart sounds.  Pulmonary:     Effort: Pulmonary effort is normal. No respiratory distress.     Breath sounds: Normal breath sounds. No wheezing or rales.  Abdominal:     Palpations: Abdomen is soft.     Tenderness: There is no abdominal tenderness.  Musculoskeletal:     Cervical back: Normal range of motion and neck supple.  Lymphadenopathy:     Cervical: No cervical adenopathy.  Skin:    General: Skin is warm and dry.     Comments: Skin exam performed with RN chaperone.  Patient has a mild erythematous rash in the skin folds below each breast.  On the inferior portion of the right breast she has a dime sized area of skin breakdown consistent with injury.  Minimal surrounding erythema without significant cellulitis.  Neurological:     Mental Status: She is alert.  Psychiatric:        Mood and  Affect: Mood normal.     ED Results / Procedures / Treatments   Labs (all labs ordered are listed, but only abnormal results are displayed) Labs Reviewed  RESP PANEL BY RT-PCR (RSV, FLU A&B, COVID)  [id number]    EKG None  Radiology No results found.  Procedures Procedures    Medications Ordered in ED Medications - No data to display  ED Course/ Medical Decision Making/ A&P    Patient seen and examined. History obtained directly from patient.   Labs/EKG: Ordered COVID, flu, RSV.  Imaging: None ordered  Medications/Fluids: None ordered  Most recent vital signs reviewed and are as follows: BP (!) 140/82   Pulse 87   Temp 98.2 F (36.8 C) (Oral)   Resp 16   Ht 5' (1.524 m)   Wt 88.5 kg   SpO2 99%   BMI 38.08 kg/m   Initial impression: URI symptoms, test for flu and COVID.  Will require symptomatic care.  Low concern for pneumonia.  Vital signs are stable and patient looks well.  As far as the breast symptoms, the small area of skin breakdown was likely due to trauma related to the carnival ride.  I do not suspect significant surrounding cellulitis at this time.  She will need to monitor the area and perform good wound care.  The rash in the skin folds is suspicious for intertrigo.  She may benefit from a trial of antifungal.  11:59 AM Reassessment performed. Patient appears stable, comfortable.  Labs personally reviewed and interpreted including: Negative COVID, flu, RSV  Reviewed pertinent lab work and imaging with patient at bedside. Questions answered.   Most current vital signs reviewed and are as follows: BP (!) 140/82   Pulse 87   Temp 98.2 F (36.8 C) (Oral)   Resp 16   Ht 5' (1.524 m)   Wt 88.5 kg   SpO2 99%   BMI 38.08 kg/m   Plan: Discharge to home.  Will provide albuterol inhaler prior to discharge.  Prescriptions written for: Clotrimazole  Other home care instructions discussed: Keeping area dry, wound care  ED return instructions  discussed: Signs of cellulitis around skin lesion  Follow-up instructions discussed: Patient encouraged to follow-up with their PCP in 7 days if symptoms or not improving.                                  Medical Decision Making  URI: Patient with  symptoms consistent with a viral syndrome. Vitals are stable, no fever. No signs of dehydration. Lung exam normal, no signs of pneumonia. Supportive therapy indicated with return if symptoms worsen.  Negative flu, COVID, RSV today.  Skin rash: Patient does have a small area of trauma on the inferior portion of the right breast.  This started after being on a carnival ride.  I do not suspect significant cellulitis at this time and feel that she can continue good wound care at home and close monitoring.  Patient educated on signs and symptoms of cellulitis.  She does have some erythema and redness of the skin folds bilaterally underneath the breasts consistent with intertrigo.  Will be given clotrimazole trial.         Final Clinical Impression(s) / ED Diagnoses Final diagnoses:  Upper respiratory tract infection, unspecified type  Intertrigo  Skin ulcer, limited to breakdown of skin (HCC)    Rx / DC Orders ED Discharge Orders          Ordered    clotrimazole (LOTRIMIN) 1 % cream        03/09/23 1140              Renne Crigler, PA-C 03/09/23 1201    Benjiman Core, MD 03/09/23 1235

## 2023-03-09 NOTE — ED Triage Notes (Signed)
In for eval of cold s/s onset yesterday am with cough and clear nasal drainage, feeling "weak", nausea and diarrhea (x3 yesterday and x1 today). Also in for lesion under right breast with rash to chest just below bilateral breasts. Pain and itching onset Tuesday after fair ride then noticed sore Tuesday night when getting ready for bed.

## 2023-12-10 ENCOUNTER — Encounter (HOSPITAL_BASED_OUTPATIENT_CLINIC_OR_DEPARTMENT_OTHER): Payer: Self-pay | Admitting: Emergency Medicine

## 2023-12-10 ENCOUNTER — Emergency Department (HOSPITAL_BASED_OUTPATIENT_CLINIC_OR_DEPARTMENT_OTHER)
Admission: EM | Admit: 2023-12-10 | Discharge: 2023-12-10 | Disposition: A | Discharge status: Home / Self Care | Attending: Emergency Medicine | Admitting: Emergency Medicine

## 2023-12-10 ENCOUNTER — Other Ambulatory Visit: Payer: Self-pay

## 2023-12-10 ENCOUNTER — Other Ambulatory Visit (HOSPITAL_BASED_OUTPATIENT_CLINIC_OR_DEPARTMENT_OTHER): Payer: Self-pay

## 2023-12-10 DIAGNOSIS — J069 Acute upper respiratory infection, unspecified: Secondary | ICD-10-CM | POA: Insufficient documentation

## 2023-12-10 DIAGNOSIS — R059 Cough, unspecified: Secondary | ICD-10-CM | POA: Diagnosis present

## 2023-12-10 DIAGNOSIS — J45909 Unspecified asthma, uncomplicated: Secondary | ICD-10-CM | POA: Diagnosis not present

## 2023-12-10 DIAGNOSIS — Z91040 Latex allergy status: Secondary | ICD-10-CM | POA: Insufficient documentation

## 2023-12-10 LAB — COMPREHENSIVE METABOLIC PANEL WITH GFR
ALT: 6 U/L (ref 0–44)
AST: 14 U/L — ABNORMAL LOW (ref 15–41)
Albumin: 4.2 g/dL (ref 3.5–5.0)
Alkaline Phosphatase: 92 U/L (ref 38–126)
Anion gap: 10 (ref 5–15)
BUN: 9 mg/dL (ref 6–20)
CO2: 23 mmol/L (ref 22–32)
Calcium: 9.5 mg/dL (ref 8.9–10.3)
Chloride: 105 mmol/L (ref 98–111)
Creatinine, Ser: 0.58 mg/dL (ref 0.44–1.00)
GFR, Estimated: >60 mL/min (ref >60)
Glucose, Bld: 89 mg/dL (ref 70–99)
Potassium: 5.2 mmol/L — ABNORMAL HIGH (ref 3.5–5.1)
Sodium: 137 mmol/L (ref 135–145)
Total Bilirubin: 0.4 mg/dL (ref 0.0–1.2)
Total Protein: 6.8 g/dL (ref 6.5–8.1)

## 2023-12-10 LAB — URINALYSIS, ROUTINE W REFLEX MICROSCOPIC
Bilirubin Urine: NEGATIVE
Glucose, UA: NEGATIVE mg/dL
Hgb urine dipstick: NEGATIVE
Ketones, ur: NEGATIVE mg/dL
Leukocytes,Ua: NEGATIVE
Nitrite: NEGATIVE
Protein, ur: NEGATIVE mg/dL
Specific Gravity, Urine: 1.02 (ref 1.005–1.030)
pH: 6.5 (ref 5.0–8.0)

## 2023-12-10 LAB — CBC WITH DIFFERENTIAL/PLATELET
Abs Immature Granulocytes: 0.01 10*3/uL (ref 0.00–0.07)
Basophils Absolute: 0.1 10*3/uL (ref 0.0–0.1)
Basophils Relative: 1 %
Eosinophils Absolute: 0.2 10*3/uL (ref 0.0–0.5)
Eosinophils Relative: 4 %
HCT: 41 % (ref 36.0–46.0)
Hemoglobin: 13.7 g/dL (ref 12.0–15.0)
Immature Granulocytes: 0 %
Lymphocytes Relative: 30 %
Lymphs Abs: 1.8 10*3/uL (ref 0.7–4.0)
MCH: 30.2 pg (ref 26.0–34.0)
MCHC: 33.4 g/dL (ref 30.0–36.0)
MCV: 90.5 fL (ref 80.0–100.0)
Monocytes Absolute: 0.4 10*3/uL (ref 0.1–1.0)
Monocytes Relative: 7 %
Neutro Abs: 3.5 10*3/uL (ref 1.7–7.7)
Neutrophils Relative %: 58 %
Platelets: 240 10*3/uL (ref 150–400)
RBC: 4.53 MIL/uL (ref 3.87–5.11)
RDW: 12.8 % (ref 11.5–15.5)
WBC: 5.9 10*3/uL (ref 4.0–10.5)
nRBC: 0 % (ref 0.0–0.2)

## 2023-12-10 LAB — RESP PANEL BY RT-PCR (RSV, FLU A&B, COVID)  RVPGX2
Influenza A by PCR: NEGATIVE
Influenza B by PCR: NEGATIVE
Resp Syncytial Virus by PCR: NEGATIVE
SARS Coronavirus 2 by RT PCR: NEGATIVE

## 2023-12-10 LAB — PREGNANCY, URINE: Preg Test, Ur: NEGATIVE

## 2023-12-10 MED ORDER — BENZONATATE 100 MG PO CAPS: 100.0000 mg | ORAL_CAPSULE | Freq: Three times a day (TID) | ORAL | 0 refills | Status: DC

## 2023-12-10 MED ORDER — BENZONATATE 100 MG PO CAPS
100.0000 mg | ORAL_CAPSULE | Freq: Three times a day (TID) | ORAL | 0 refills | Status: DC
  Filled 2023-12-10: qty 21, 7d supply, fill #0

## 2023-12-10 MED ORDER — ACETAMINOPHEN 500 MG PO TABS
1000.0000 mg | ORAL_TABLET | Freq: Once | ORAL | Status: AC
  Administered 2023-12-10: 1000 mg via ORAL
  Filled 2023-12-10: qty 2

## 2023-12-10 MED ORDER — GUAIFENESIN 100 MG/5ML PO LIQD
200.0000 mg | Freq: Three times a day (TID) | ORAL | 0 refills | Status: DC | PRN
  Filled 2023-12-10: qty 473, 16d supply, fill #0

## 2023-12-10 NOTE — ED Provider Notes (Signed)
 [place] EMERGENCY DEPARTMENT AT [facility] Provider Note   CSN: [id number] Arrival date & time: 12/10/23  1134     Patient presents with: URI   Hannah Rodriguez is a 43 y.o. female with past medical history of asthma, depression, anxiety presented to emergency room with complaint of 1 week of cough, congestion, runny nose, body aches and intermittent headache.  Patient reports her headache is began to improve significantly last day or so.  Notes sick contact to daughter with similar symptoms.  She denies fever, productive cough, chest pain or shortness of breath. Has noted smell to urine, but no frequency or dysuria.    URI Presenting symptoms: cough        Prior to Admission medications   Medication Sig Start Date End Date Taking? Authorizing Provider  benzonatate (TESSALON) 100 MG capsule Take 1 capsule (100 mg total) by mouth every 8 (eight) hours. 12/10/23  Yes Diontae Route, Kandace Organ, PA-C  albuterol  (PROVENTIL ) (2.5 MG/3ML) 0.083% nebulizer solution Take 3 mLs (2.5 mg total) by nebulization every 4 (four) hours as needed for wheezing or shortness of breath. 04/26/22   Ann Keto, MD  albuterol  (VENTOLIN  HFA) 108 (90 Base) MCG/ACT inhaler Inhale 2 puffs into the lungs every 4 (four) hours as needed for wheezing or shortness of breath. 04/26/22   Banister, Pamela K, MD  ALPRAZolam (XANAX) 0.5 MG tablet Take 0.5 mg by mouth every 6 (six) hours as needed. 08/03/22   [provider]  cetirizine (ZYRTEC) 10 MG tablet Take 10 mg by mouth every evening.    [provider]  clotrimazole  (LOTRIMIN ) 1 % cream Apply to affected area 2 times daily 03/09/23   Lyna Sandhoff, PA-C    Allergies: Codeine  and Latex    Review of Systems  Respiratory:  Positive for cough.     Updated Vital Signs BP (!) 150/81 (BP Location: Right Arm)   Pulse 79   Temp 97.9 F (36.6 C)   Resp 16   LMP 11/30/2023 (Approximate)   SpO2 100%   Physical Exam Vitals and nursing note  reviewed.  Constitutional:      General: She is not in acute distress.    Appearance: She is not toxic-appearing.  HENT:     Head: Normocephalic and atraumatic.   Eyes:     General: No scleral icterus.    Conjunctiva/sclera: Conjunctivae normal.    Cardiovascular:     Rate and Rhythm: Normal rate and regular rhythm.     Pulses: Normal pulses.     Heart sounds: Normal heart sounds.  Pulmonary:     Effort: Pulmonary effort is normal. No respiratory distress.     Breath sounds: Normal breath sounds.  Abdominal:     General: Abdomen is flat. Bowel sounds are normal.     Palpations: Abdomen is soft.     Tenderness: There is no abdominal tenderness.   Musculoskeletal:     Right lower leg: No edema.     Left lower leg: No edema.   Skin:    General: Skin is warm and dry.     Findings: No lesion.   Neurological:     General: No focal deficit present.     Mental Status: She is alert and oriented to person, place, and time. Mental status is at baseline.     (all labs ordered are listed, but only abnormal results are displayed) Labs Reviewed  COMPREHENSIVE METABOLIC PANEL WITH GFR - Abnormal; Notable for the following components:  Result Value   Potassium 5.2 (*)    AST 14 (*)    All other components within normal limits  URINALYSIS, ROUTINE W REFLEX MICROSCOPIC - Abnormal; Notable for the following components:   Color, Urine STRAW (*)    All other components within normal limits  RESP PANEL BY RT-PCR (RSV, FLU A&B, COVID)  RVPGX2  CBC WITH DIFFERENTIAL/PLATELET  PREGNANCY, URINE    EKG: None  Radiology: No results found.   Procedures   Medications Ordered in the ED  acetaminophen  (TYLENOL ) tablet 1,000 mg (1,000 mg Oral Given 12/10/23 1227)                                    Medical Decision Making Amount and/or Complexity of Data Reviewed Labs: ordered.  Risk OTC drugs. Prescription drug management.   Gunnar Leff Morell 43 y.o. presented today for  URI like symptoms. Working DDx that I considered at this time includes, but not limited to, viral illness, pharyngitis, mono, sinusitis, electrolyte abnormality, AOM.  R/o DDx:: these additional diagnoses are not consistent with patient's history, presentation, physical exam, labs/imaging findings.  Labs:  Respiratory Panel: Negative UA: negative, no ketones CBC, BMP wnl   Problem List / ED Course / Critical interventions / Medication management  Senting to emergency room with URI-like symptoms.  Her lungs are clear to auscultation bilaterally with no wheezing.  She is not hypoxic and has normal respiratory effort.  She denies any chest pain or shortness of breath.  She has not had fever at home or chills and does not have fever here.  On my exam she has no focal area of abdominal tenderness.  She has noted some strange smell to her urine but denies any dysuria.  UA is unremarkable here.  CBC and BMP without significant electrolyte abnormality.  She does not appear dehydrated and is tolerating oral intake.  Overall reassuring workup feel this is most likely consistent with respiratory infection with cough.  Will send home with symptomatic management.  Discussed return precaution. I ordered medication including tylenol  for body aches Reevaluation of the patient after these medicines showed that the patient improved Patients vitals assessed. Upon arrival patient is hemodynamically stable.  I have reviewed the patients home medicines and have made adjustments as needed     Plan: F/u w/ PCP in 2-3d to ensure resolution of sx.  Patient was given return precautions. Patient stable for discharge at this time.  Patient educated on sx and dx and verbalized understanding of plan. Return to ER if new or worsening sx.       Final diagnoses:  Viral URI with cough    ED Discharge Orders          Ordered    benzonatate (TESSALON) 100 MG capsule  Every 8 hours        12/10/23 1250                Adeja Sarratt N, PA-C 12/10/23 1426    Mozell Arias, MD 12/12/23 1624

## 2023-12-10 NOTE — ED Notes (Signed)
 Reviewed AVS/discharge instruction with patient. Time allotted for and all questions answered. Patient is agreeable for d/c and escorted to ed exit by staff.

## 2023-12-10 NOTE — Discharge Instructions (Addendum)
 Make sure you are staying well hydrated, drink primarily water but you can alternate electrolyte drink like Pedialyte or Gatorade.  Try warm tea with honey and lemon, cough drops.  You can alternate Tylenol  and ibuprofen  for body aches and headaches. Return to emergency room with new or worsening symptoms like chest pain, shortness of breath or productive cough.   Purchase over the counter Robitussin if not covered by insurance.

## 2023-12-10 NOTE — ED Triage Notes (Signed)
 Uri x 1 week Pressure in head my head is about to blow up Feeling weak and nausea Body aches

## 2024-01-28 ENCOUNTER — Other Ambulatory Visit: Payer: Self-pay | Admitting: Internal Medicine

## 2024-01-28 DIAGNOSIS — Z1231 Encounter for screening mammogram for malignant neoplasm of breast: Secondary | ICD-10-CM

## 2024-01-31 ENCOUNTER — Ambulatory Visit
Admission: RE | Admit: 2024-01-31 | Discharge: 2024-01-31 | Disposition: A | Discharge status: Home / Self Care | Source: Ambulatory Visit | Attending: Internal Medicine | Admitting: Internal Medicine

## 2024-01-31 DIAGNOSIS — Z1231 Encounter for screening mammogram for malignant neoplasm of breast: Secondary | ICD-10-CM

## 2024-02-03 ENCOUNTER — Ambulatory Visit (INDEPENDENT_AMBULATORY_CARE_PROVIDER_SITE_OTHER)

## 2024-02-03 ENCOUNTER — Ambulatory Visit: Admitting: Podiatry

## 2024-02-03 VITALS — Ht 60.0 in | Wt 193.0 lb

## 2024-02-03 DIAGNOSIS — M722 Plantar fascial fibromatosis: Secondary | ICD-10-CM

## 2024-02-03 DIAGNOSIS — M7751 Other enthesopathy of right foot: Secondary | ICD-10-CM

## 2024-02-03 NOTE — Patient Instructions (Addendum)
 VISIT SUMMARY: You came in today because of right heel pain that has been bothering you for the past three to four months. The pain is worse in the morning and with activity, and it sometimes feels like there is swelling or a movable 'ball' in your foot. You have been using Motrin  for pain relief, which has helped somewhat.  YOUR PLAN: -RIGHT FOOT PLANTAR FASCIITIS: Plantar fasciitis is inflammation of the band of tissue that runs across the bottom of your foot and connects your heel bone to your toes. We will give you a steroid injection in your right foot to help reduce the inflammation and pain. You should continue taking 800 mg of Motrin  for pain management. Additionally, we recommend that you buy supportive footwear and arch supports like PowerStep or Super Feet. We have provided you with physical therapy exercises to do at home twice daily, which should help improve your condition over the next two to three months. If your pain does not improve by at least 50% after six weeks, we may consider a second injection. Most patients see about 50% improvement six weeks after the first injection.  INSTRUCTIONS: Please schedule a follow-up visit in six weeks to assess your progress.  Plantar Fasciitis (Heel Spur Syndrome) with Rehab The plantar fascia is a fibrous, ligament-like, soft-tissue structure that spans the bottom of the foot. Plantar fasciitis is a condition that causes pain in the foot due to inflammation of the tissue. SYMPTOMS  Pain and tenderness on the underneath side of the foot. Pain that worsens with standing or walking. CAUSES  Plantar fasciitis is caused by irritation and injury to the plantar fascia on the underneath side of the foot. Common mechanisms of injury include: Direct trauma to bottom of the foot. Damage to a small nerve that runs under the foot where the main fascia attaches to the heel bone. Stress placed on the plantar fascia due to bone spurs. RISK INCREASES WITH:   Activities that place stress on the plantar fascia (running, jumping, pivoting, or cutting). Poor strength and flexibility. Improperly fitted shoes. Tight calf muscles. Flat feet. Failure to warm-up properly before activity. Obesity. PREVENTION Warm up and stretch properly before activity. Allow for adequate recovery between workouts. Maintain physical fitness: Strength, flexibility, and endurance. Cardiovascular fitness. Maintain a health body weight. Avoid stress on the plantar fascia. Wear properly fitted shoes, including arch supports for individuals who have flat feet.  PROGNOSIS  If treated properly, then the symptoms of plantar fasciitis usually resolve without surgery. However, occasionally surgery is necessary.  RELATED COMPLICATIONS  Recurrent symptoms that may result in a chronic condition. Problems of the lower back that are caused by compensating for the injury, such as limping. Pain or weakness of the foot during push-off following surgery. Chronic inflammation, scarring, and partial or complete fascia tear, occurring more often from repeated injections.  TREATMENT  Treatment initially involves the use of ice and medication to help reduce pain and inflammation. The use of strengthening and stretching exercises may help reduce pain with activity, especially stretches of the Achilles tendon. These exercises may be performed at home or with a therapist. Your caregiver may recommend that you use heel cups of arch supports to help reduce stress on the plantar fascia. Occasionally, corticosteroid injections are given to reduce inflammation. If symptoms persist for greater than 6 months despite non-surgical (conservative), then surgery may be recommended.   MEDICATION  If pain medication is necessary, then nonsteroidal anti-inflammatory medications, such as aspirin and ibuprofen ,  or other minor pain relievers, such as acetaminophen , are often recommended. Do not take pain  medication within 7 days before surgery. Prescription pain relievers may be given if deemed necessary by your caregiver. Use only as directed and only as much as you need. Corticosteroid injections may be given by your caregiver. These injections should be reserved for the most serious cases, because they may only be given a certain number of times.  HEAT AND COLD Cold treatment (icing) relieves pain and reduces inflammation. Cold treatment should be applied for 10 to 15 minutes every 2 to 3 hours for inflammation and pain and immediately after any activity that aggravates your symptoms. Use ice packs or massage the area with a piece of ice (ice massage). Heat treatment may be used prior to performing the stretching and strengthening activities prescribed by your caregiver, physical therapist, or athletic trainer. Use a heat pack or soak the injury in warm water.  SEEK IMMEDIATE MEDICAL CARE IF: Treatment seems to offer no benefit, or the condition worsens. Any medications produce adverse side effects.  EXERCISES- RANGE OF MOTION (ROM) AND STRETCHING EXERCISES - Plantar Fasciitis (Heel Spur Syndrome) These exercises may help you when beginning to rehabilitate your injury. Your symptoms may resolve with or without further involvement from your physician, physical therapist or athletic trainer. While completing these exercises, remember:  Restoring tissue flexibility helps normal motion to return to the joints. This allows healthier, less painful movement and activity. An effective stretch should be held for at least 30 seconds. A stretch should never be painful. You should only feel a gentle lengthening or release in the stretched tissue.  RANGE OF MOTION - Toe Extension, Flexion Sit with your right / left leg crossed over your opposite knee. Grasp your toes and gently pull them back toward the top of your foot. You should feel a stretch on the bottom of your toes and/or foot. Hold this stretch  for 10 seconds. Now, gently pull your toes toward the bottom of your foot. You should feel a stretch on the top of your toes and or foot. Hold this stretch for 10 seconds. Repeat  times. Complete this stretch 3 times per day.   RANGE OF MOTION - Ankle Dorsiflexion, Active Assisted Remove shoes and sit on a chair that is preferably not on a carpeted surface. Place right / left foot under knee. Extend your opposite leg for support. Keeping your heel down, slide your right / left foot back toward the chair until you feel a stretch at your ankle or calf. If you do not feel a stretch, slide your bottom forward to the edge of the chair, while still keeping your heel down. Hold this stretch for 10 seconds. Repeat 3 times. Complete this stretch 2 times per day.   STRETCH  Gastroc, Standing Place hands on wall. Extend right / left leg, keeping the front knee somewhat bent. Slightly point your toes inward on your back foot. Keeping your right / left heel on the floor and your knee straight, shift your weight toward the wall, not allowing your back to arch. You should feel a gentle stretch in the right / left calf. Hold this position for 10 seconds. Repeat 3 times. Complete this stretch 2 times per day.  STRETCH  Soleus, Standing Place hands on wall. Extend right / left leg, keeping the other knee somewhat bent. Slightly point your toes inward on your back foot. Keep your right / left heel on the floor, bend your  back knee, and slightly shift your weight over the back leg so that you feel a gentle stretch deep in your back calf. Hold this position for 10 seconds. Repeat 3 times. Complete this stretch 2 times per day.  STRETCH  Gastrocsoleus, Standing  Note: This exercise can place a lot of stress on your foot and ankle. Please complete this exercise only if specifically instructed by your caregiver.  Place the ball of your right / left foot on a step, keeping your other foot firmly on the same  step. Hold on to the wall or a rail for balance. Slowly lift your other foot, allowing your body weight to press your heel down over the edge of the step. You should feel a stretch in your right / left calf. Hold this position for 10 seconds. Repeat this exercise with a slight bend in your right / left knee. Repeat 3 times. Complete this stretch 2 times per day.   STRENGTHENING EXERCISES - Plantar Fasciitis (Heel Spur Syndrome)  These exercises may help you when beginning to rehabilitate your injury. They may resolve your symptoms with or without further involvement from your physician, physical therapist or athletic trainer. While completing these exercises, remember:  Muscles can gain both the endurance and the strength needed for everyday activities through controlled exercises. Complete these exercises as instructed by your physician, physical therapist or athletic trainer. Progress the resistance and repetitions only as guided.  STRENGTH - Towel Curls Sit in a chair positioned on a non-carpeted surface. Place your foot on a towel, keeping your heel on the floor. Pull the towel toward your heel by only curling your toes. Keep your heel on the floor. Repeat 3 times. Complete this exercise 2 times per day.  STRENGTH - Ankle Inversion Secure one end of a rubber exercise band/tubing to a fixed object (table, pole). Loop the other end around your foot just before your toes. Place your fists between your knees. This will focus your strengthening at your ankle. Slowly, pull your big toe up and in, making sure the band/tubing is positioned to resist the entire motion. Hold this position for 10 seconds. Have your muscles resist the band/tubing as it slowly pulls your foot back to the starting position. Repeat 3 times. Complete this exercises 2 times per day.  Document Released: 06/11/2005 Document Revised: 09/03/2011 Document Reviewed: 09/23/2008 [facility] Patient Information 2014 [place],  [place].                     Contains text generated by [employer].           Plantar Fasciitis (Heel Spur Syndrome) with Rehab The plantar fascia is a fibrous, ligament-like, soft-tissue structure that spans the bottom of the foot. Plantar fasciitis is a condition that causes pain in the foot due to inflammation of the tissue. SYMPTOMS  Pain and tenderness on the underneath side of the foot. Pain that worsens with standing or walking. CAUSES  Plantar fasciitis is caused by irritation and injury to the plantar fascia on the underneath side of the foot. Common mechanisms of injury include: Direct trauma to bottom of the foot. Damage to a small nerve that runs under the foot where the main fascia attaches to the heel bone. Stress placed on the plantar fascia due to bone spurs. RISK INCREASES WITH:  Activities that place stress on the plantar fascia (running, jumping, pivoting, or cutting). Poor strength and flexibility. Improperly fitted shoes. Tight calf muscles. Flat feet. Failure to warm-up  properly before activity. Obesity. PREVENTION Warm up and stretch properly before activity. Allow for adequate recovery between workouts. Maintain physical fitness: Strength, flexibility, and endurance. Cardiovascular fitness. Maintain a health body weight. Avoid stress on the plantar fascia. Wear properly fitted shoes, including arch supports for individuals who have flat feet.  PROGNOSIS  If treated properly, then the symptoms of plantar fasciitis usually resolve without surgery. However, occasionally surgery is necessary.  RELATED COMPLICATIONS  Recurrent symptoms that may result in a chronic condition. Problems of the lower back that are caused by compensating for the injury, such as limping. Pain or weakness of the foot during push-off following surgery. Chronic inflammation, scarring, and partial or complete fascia tear, occurring more often from repeated  injections.  TREATMENT  Treatment initially involves the use of ice and medication to help reduce pain and inflammation. The use of strengthening and stretching exercises may help reduce pain with activity, especially stretches of the Achilles tendon. These exercises may be performed at home or with a therapist. Your caregiver may recommend that you use heel cups of arch supports to help reduce stress on the plantar fascia. Occasionally, corticosteroid injections are given to reduce inflammation. If symptoms persist for greater than 6 months despite non-surgical (conservative), then surgery may be recommended.   MEDICATION  If pain medication is necessary, then nonsteroidal anti-inflammatory medications, such as aspirin and ibuprofen , or other minor pain relievers, such as acetaminophen , are often recommended. Do not take pain medication within 7 days before surgery. Prescription pain relievers may be given if deemed necessary by your caregiver. Use only as directed and only as much as you need. Corticosteroid injections may be given by your caregiver. These injections should be reserved for the most serious cases, because they may only be given a certain number of times.  HEAT AND COLD Cold treatment (icing) relieves pain and reduces inflammation. Cold treatment should be applied for 10 to 15 minutes every 2 to 3 hours for inflammation and pain and immediately after any activity that aggravates your symptoms. Use ice packs or massage the area with a piece of ice (ice massage). Heat treatment may be used prior to performing the stretching and strengthening activities prescribed by your caregiver, physical therapist, or athletic trainer. Use a heat pack or soak the injury in warm water.  SEEK IMMEDIATE MEDICAL CARE IF: Treatment seems to offer no benefit, or the condition worsens. Any medications produce adverse side effects.  EXERCISES- RANGE OF MOTION (ROM) AND STRETCHING EXERCISES - Plantar  Fasciitis (Heel Spur Syndrome) These exercises may help you when beginning to rehabilitate your injury. Your symptoms may resolve with or without further involvement from your physician, physical therapist or athletic trainer. While completing these exercises, remember:  Restoring tissue flexibility helps normal motion to return to the joints. This allows healthier, less painful movement and activity. An effective stretch should be held for at least 30 seconds. A stretch should never be painful. You should only feel a gentle lengthening or release in the stretched tissue.  RANGE OF MOTION - Toe Extension, Flexion Sit with your right / left leg crossed over your opposite knee. Grasp your toes and gently pull them back toward the top of your foot. You should feel a stretch on the bottom of your toes and/or foot. Hold this stretch for 10 seconds. Now, gently pull your toes toward the bottom of your foot. You should feel a stretch on the top of your toes and or foot. Hold this stretch for  10 seconds. Repeat  times. Complete this stretch 3 times per day.   RANGE OF MOTION - Ankle Dorsiflexion, Active Assisted Remove shoes and sit on a chair that is preferably not on a carpeted surface. Place right / left foot under knee. Extend your opposite leg for support. Keeping your heel down, slide your right / left foot back toward the chair until you feel a stretch at your ankle or calf. If you do not feel a stretch, slide your bottom forward to the edge of the chair, while still keeping your heel down. Hold this stretch for 10 seconds. Repeat 3 times. Complete this stretch 2 times per day.   STRETCH  Gastroc, Standing Place hands on wall. Extend right / left leg, keeping the front knee somewhat bent. Slightly point your toes inward on your back foot. Keeping your right / left heel on the floor and your knee straight, shift your weight toward the wall, not allowing your back to arch. You should feel a  gentle stretch in the right / left calf. Hold this position for 10 seconds. Repeat 3 times. Complete this stretch 2 times per day.  STRETCH  Soleus, Standing Place hands on wall. Extend right / left leg, keeping the other knee somewhat bent. Slightly point your toes inward on your back foot. Keep your right / left heel on the floor, bend your back knee, and slightly shift your weight over the back leg so that you feel a gentle stretch deep in your back calf. Hold this position for 10 seconds. Repeat 3 times. Complete this stretch 2 times per day.  STRETCH  Gastrocsoleus, Standing  Note: This exercise can place a lot of stress on your foot and ankle. Please complete this exercise only if specifically instructed by your caregiver.  Place the ball of your right / left foot on a step, keeping your other foot firmly on the same step. Hold on to the wall or a rail for balance. Slowly lift your other foot, allowing your body weight to press your heel down over the edge of the step. You should feel a stretch in your right / left calf. Hold this position for 10 seconds. Repeat this exercise with a slight bend in your right / left knee. Repeat 3 times. Complete this stretch 2 times per day.   STRENGTHENING EXERCISES - Plantar Fasciitis (Heel Spur Syndrome)  These exercises may help you when beginning to rehabilitate your injury. They may resolve your symptoms with or without further involvement from your physician, physical therapist or athletic trainer. While completing these exercises, remember:  Muscles can gain both the endurance and the strength needed for everyday activities through controlled exercises. Complete these exercises as instructed by your physician, physical therapist or athletic trainer. Progress the resistance and repetitions only as guided.  STRENGTH - Towel Curls Sit in a chair positioned on a non-carpeted surface. Place your foot on a towel, keeping your heel on the  floor. Pull the towel toward your heel by only curling your toes. Keep your heel on the floor. Repeat 3 times. Complete this exercise 2 times per day.  STRENGTH - Ankle Inversion Secure one end of a rubber exercise band/tubing to a fixed object (table, pole). Loop the other end around your foot just before your toes. Place your fists between your knees. This will focus your strengthening at your ankle. Slowly, pull your big toe up and in, making sure the band/tubing is positioned to resist the entire motion. Hold  this position for 10 seconds. Have your muscles resist the band/tubing as it slowly pulls your foot back to the starting position. Repeat 3 times. Complete this exercises 2 times per day.  Document Released: 06/11/2005 Document Revised: 09/03/2011 Document Reviewed: 09/23/2008 [facility] Patient Information 2014 [place], [place].

## 2024-02-03 NOTE — Progress Notes (Signed)
 Subjective:  Patient ID: Hannah Rodriguez, female    DOB: 1980-12-26,  MRN: [id number]  Chief Complaint  Patient presents with   Foot Pain    RM 22 Patient is here for right foot pain. Pain is located on the bottom of the right foot and radiates towards the leg. Pain has been present for the last three months. Pain is described as a stabbing pain.    Discussed the use of AI scribe software for clinical note transcription with the patient, who gave verbal consent to proceed.  History of Present Illness Hannah Rodriguez is a 43 year old female who presents with right heel pain.  She has been experiencing right heel pain for the past three to four months, described as starting in the morning and worsening with activity. The pain is located on the bottom of the foot and sometimes radiates to the back, accompanied by sensations of swelling and a 'ball' that can be moved around. No specific injury initiated the pain.  She has a history of a plantar wart on the same foot, but in a different location. She has been taking 800 mg of Motrin  for pain management, which she also uses for her degenerative disc disease. She takes breaks from the medication when she does not have migraines and has been using it intermittently for the heel pain over the past two weeks, noting some relief.  She works at [employer], performing physically demanding tasks, and wears [employer] shoes, which she initially found comfortable but noticed they wore out quickly. 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 She does not recall any injury that initiated the pain.      Objective:    Physical Exam VASCULAR: DP and PT pulse palpable. Foot is warm and well-perfused. Capillary fill time is brisk. DERMATOLOGIC: Normal skin turgor, texture, and temperature. No open lesions, rashes, or ulcerations. NEUROLOGIC: Normal sensation to light touch and pressure. No paresthesias. ORTHOPEDIC: Sharp pain on palpation of plantar medial  band of plantar fascia at insertion on plantar medial calcaneus. Smooth pain-free range of motion of all other examined joints. No ecchymosis, bruising, or gross deformity.   No images are attached to the encounter.    Results RADIOLOGY Right foot radiograph: Moderate-sized heel spur on plantar calcaneus without fracture or stress fracture (02/03/2024)   Assessment:   1. Plantar fasciitis of right foot      Plan:  Patient was evaluated and treated and all questions answered.  Assessment and Plan Assessment & Plan Right foot plantar fasciitis Chronic plantar fasciitis in the right foot, persisting for approximately three to four months. Pain localized to the plantar medial band of the plantar fascia at the insertion on the medial band of the plantar medial calcaneus. Radiograph shows moderate-sized heel spur on the plantar calcaneus without fracture. Heel spur is not the source of pain. Current use of 800 mg Motrin  for pain management, with partial relief. - Administer steroid injection to the plantar fascia of the right foot. Discussed low risk of systemic side effects compared to oral steroids. Injection may cause temporary discomfort, including sharp pinch and burn, lasting only a few seconds. - Recommend continuation of 800 mg Motrin  for pain management. - Advise purchase of supportive footwear and arch support, such as PowerStep or Super Feet. - Provide physical therapy exercises to be performed at home twice daily to improve condition over two to three months. - If improvement is less than 50% after six weeks, a second injection may  be considered. Most patients experience about 50% improvement six weeks post-injection. - Schedule follow-up visit in six weeks to assess progress.   After sterile prep with povidone-iodine solution and alcohol, the right heel was injected with 0.5cc 2% xylocaine  plain, 0.5cc 0.5% marcaine  plain, 20 mg triamcinolone  acetonide, and 4 mg dexamethasone  was injected along the medial plantar fascia at the insertion on the plantar calcaneus. The patient tolerated the procedure well without complication.    Return in about 6 weeks (around 03/16/2024) for recheck plantar fasciitis.

## 2024-02-05 ENCOUNTER — Other Ambulatory Visit (HOSPITAL_BASED_OUTPATIENT_CLINIC_OR_DEPARTMENT_OTHER): Payer: Self-pay

## 2024-02-05 ENCOUNTER — Emergency Department (HOSPITAL_BASED_OUTPATIENT_CLINIC_OR_DEPARTMENT_OTHER)
Admission: EM | Admit: 2024-02-05 | Discharge: 2024-02-05 | Disposition: A | Discharge status: Home / Self Care | Attending: Emergency Medicine | Admitting: Emergency Medicine

## 2024-02-05 ENCOUNTER — Other Ambulatory Visit: Payer: Self-pay

## 2024-02-05 ENCOUNTER — Encounter (HOSPITAL_BASED_OUTPATIENT_CLINIC_OR_DEPARTMENT_OTHER): Payer: Self-pay

## 2024-02-05 DIAGNOSIS — T2122XA Burn of second degree of abdominal wall, initial encounter: Secondary | ICD-10-CM | POA: Diagnosis not present

## 2024-02-05 DIAGNOSIS — J45909 Unspecified asthma, uncomplicated: Secondary | ICD-10-CM | POA: Insufficient documentation

## 2024-02-05 DIAGNOSIS — S3991XA Unspecified injury of abdomen, initial encounter: Secondary | ICD-10-CM | POA: Diagnosis present

## 2024-02-05 DIAGNOSIS — X111XXA Contact with running hot water, initial encounter: Secondary | ICD-10-CM | POA: Insufficient documentation

## 2024-02-05 DIAGNOSIS — Z91040 Latex allergy status: Secondary | ICD-10-CM | POA: Diagnosis not present

## 2024-02-05 DIAGNOSIS — Y93G3 Activity, cooking and baking: Secondary | ICD-10-CM | POA: Insufficient documentation

## 2024-02-05 MED ORDER — BACITRACIN ZINC 500 UNIT/GM EX OINT
TOPICAL_OINTMENT | Freq: Once | CUTANEOUS | Status: AC
  Administered 2024-02-05 (×2): 1 via TOPICAL
  Filled 2024-02-05: qty 28.35

## 2024-02-05 MED ORDER — BACITRACIN 500 UNIT/GM EX OINT
1.0000 | TOPICAL_OINTMENT | Freq: Two times a day (BID) | CUTANEOUS | 0 refills | Status: DC
  Filled 2024-02-05: qty 28, 16d supply, fill #0
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
## 2024-02-05 NOTE — Discharge Instructions (Signed)
 As discussed, we will send an antibiotic ointment to place twice daily on the abdomen.  Recommend washing gently with warm soapy water and placing a nonstick sterile dressing over the area.  Change dressing at least once daily but you may change this twice whenever you apply the antibiotic ointment daily.  Recommend follow-up with primary care if they are comfortable following this we will also give you the burn center information in [place].  Please not hesitate to return to emergency department if the worrisome signs and symptoms discussed become apparent.
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
## 2024-02-27 ENCOUNTER — Telehealth: Payer: Self-pay | Admitting: Podiatry

## 2024-02-27 NOTE — Telephone Encounter (Signed)
 Called and left a voicemail for patient to call back and  get rescheduled for her next appointment.

## 2024-03-16 ENCOUNTER — Ambulatory Visit: Admitting: Podiatry

## 2024-03-25 ENCOUNTER — Other Ambulatory Visit (HOSPITAL_COMMUNITY)
Admission: RE | Admit: 2024-03-25 | Discharge: 2024-03-25 | Disposition: A | Discharge status: Home / Self Care | Source: Ambulatory Visit | Attending: Obstetrics and Gynecology | Admitting: Obstetrics and Gynecology

## 2024-03-25 ENCOUNTER — Encounter: Payer: Self-pay | Admitting: Obstetrics and Gynecology

## 2024-03-25 ENCOUNTER — Ambulatory Visit: Admitting: Obstetrics and Gynecology

## 2024-03-25 VITALS — BP 128/83 | HR 64 | Ht 60.0 in | Wt 188.6 lb

## 2024-03-25 DIAGNOSIS — Z113 Encounter for screening for infections with a predominantly sexual mode of transmission: Secondary | ICD-10-CM | POA: Diagnosis not present

## 2024-03-25 DIAGNOSIS — Z124 Encounter for screening for malignant neoplasm of cervix: Secondary | ICD-10-CM | POA: Insufficient documentation

## 2024-03-25 DIAGNOSIS — L0292 Furuncle, unspecified: Secondary | ICD-10-CM

## 2024-03-25 DIAGNOSIS — Z01419 Encounter for gynecological examination (general) (routine) without abnormal findings: Secondary | ICD-10-CM | POA: Insufficient documentation

## 2024-03-25 MED ORDER — CLINDAMYCIN PHOSPHATE 1 % EX SOLN: Freq: Two times a day (BID) | CUTANEOUS | 1 refills | Status: AC

## 2024-03-25 NOTE — Progress Notes (Signed)
 Pt presents for AEX Last PAP approx. 5 years ago Requesting PAP and STD testing  Mammogram 01/2024 Pt report heavy, prolonged, irregular periods

## 2024-03-25 NOTE — Progress Notes (Unsigned)
 ANNUAL EXAM Patient name: Hannah Rodriguez MRN [id number]  Date of birth: 24-May-1981 Chief Complaint:   No chief complaint on file.  History of Present Illness:   Hannah Rodriguez is a 43 y.o. [phone]  female being seen today for a routine annual exam.  Current complaints: endorses heavy periods, reports cycles have gotten longer  they used to be  last month had two cycles in September. Also endorses hx of boils in vaginal area and thighs, buttocks. Has had them for a few years. They will drain on their own   Reports that mother had cancerous polyps removed and they told  patient that she needs to start colonoscopy prior to age 75   The pregnancy intention screening data noted above was reviewed. Potential methods of contraception were discussed. The patient elected to proceed with No data recorded.   Gynecologic History Patient's last menstrual period was . Contraception:  Last Eje:mzenmud pap 5 years ago  Last mammogram:01/2024 . Results were:Normal     03/25/2024    8:15 AM  Depression screen PHQ 2/9  Decreased Interest 1  Down, Depressed, Hopeless 1  PHQ - 2 Score 2  Altered sleeping 1  Tired, decreased energy 2  Change in appetite 1  Feeling bad or failure about yourself  0  Trouble concentrating 1  Moving slowly or fidgety/restless 0  Suicidal thoughts 0  PHQ-9 Score 7        03/25/2024    8:15 AM  GAD 7 : Generalized Anxiety Score  Nervous, Anxious, on Edge 1  Control/stop worrying 1  Worry too much - different things 1  Trouble relaxing 1  Restless 1  Easily annoyed or irritable 1  Afraid - awful might happen 0  Total GAD 7 Score 6     Review of Systems:   Pertinent items are noted in HPI Denies any headaches, blurred vision, fatigue, shortness of breath, chest pain, abdominal pain, abnormal vaginal discharge/itching/odor/irritation, problems with periods, bowel movements, urination, or intercourse unless otherwise stated above. Pertinent History Reviewed:   Reviewed past medical,surgical, social and family history.  Reviewed problem list, medications and allergies. Physical Assessment:   Vitals:   03/25/24 0814  BP: 128/83  Pulse: 64  Weight: 188 lb 9.6 oz (85.5 kg)  Height: 5' (1.524 m)  Body mass index is 36.83 kg/m.        Physical Examination:   General appearance - well appearing, and in no distress  Mental status - alert, oriented   Psych:  She has a normal mood and affect  Skin - warm and dry, normal color  Chest - effort normal, all lung fields clear to auscultation bilaterally  Heart - normal rate and regular rhythm  Neck:  midline trachea, no thyromegaly or nodules  Breasts - breasts appear normal, no suspicious masses, no skin or nipple changes or  axillary nodes  Abdomen - soft, nontender, nondistended  Pelvic - VULVA: normal appearing vulva with no masses, tenderness or lesions  VAGINA: normal appearing vagina with normal color and discharge, no lesions  CERVIX: normal appearing cervix without discharge or lesions, no CMT  Thin prep pap is done w HR HPV cotesting  UTERUS: uterus is felt to be normal size, shape, consistency and nontender   ADNEXA: No adnexal masses or tenderness noted.  Extremities:  No swelling or varicosities noted  Chaperone present for exam  No results found for this or any previous visit (from the past 24 hours).  Assessment &  Plan:  1. Encounter for annual routine gynecological examination (Primary) Pap today, request records from previous pap UTD on mammogram  Referral placed for colonoscopy given hx  - Cytology - PAP( [place]) - Ambulatory referral to Gastroenterology  2. Cervical cancer screening  - Cytology - PAP( [place])  3. Screen for STD (sexually transmitted disease)  - HIV antibody (with reflex) - RPR - Hepatitis C Antibody - Hepatitis B Surface AntiGEN - Cervicovaginal ancillary only( [place])  4. Boils  - clindamycin (CLEOCIN T) 1 % external solution;  Apply topically 2 (two) times daily.  Dispense: 30 mL; Refill: 1  Labs/procedures today:   Mammogram: in 1 year, or sooner if problems Colonoscopy: @ 45yo, or sooner if problems  Orders Placed This Encounter  Procedures   HIV antibody (with reflex)   RPR   Hepatitis C Antibody   Hepatitis B Surface AntiGEN   Ambulatory referral to Gastroenterology    Meds:  Meds ordered this encounter  Medications   clindamycin (CLEOCIN T) 1 % external solution    Sig: Apply topically 2 (two) times daily.    Dispense:  30 mL    Refill:  1    Follow-up: Return in about 1 year (around 03/25/2025) for [place] Hannah Delores, FNP

## 2024-03-26 ENCOUNTER — Ambulatory Visit: Payer: Self-pay | Admitting: Obstetrics and Gynecology

## 2024-03-26 LAB — CERVICOVAGINAL ANCILLARY ONLY
Chlamydia: NEGATIVE
Comment: NEGATIVE
Comment: NEGATIVE
Comment: NORMAL
Neisseria Gonorrhea: NEGATIVE
Trichomonas: NEGATIVE

## 2024-03-26 LAB — RPR: RPR Ser Ql: NONREACTIVE

## 2024-03-26 LAB — HIV ANTIBODY (ROUTINE TESTING W REFLEX): HIV Screen 4th Generation wRfx: NONREACTIVE

## 2024-03-26 LAB — HEPATITIS C ANTIBODY: Hep C Virus Ab: NONREACTIVE

## 2024-03-26 LAB — HEPATITIS B SURFACE ANTIGEN: Hepatitis B Surface Ag: NEGATIVE

## 2024-03-27 LAB — CYTOLOGY - PAP
Comment: NEGATIVE
Comment: NEGATIVE
Comment: NEGATIVE
Diagnosis: UNDETERMINED — AB
HPV 16: NEGATIVE
HPV 18 / 45: NEGATIVE
High risk HPV: POSITIVE — AB

## 2024-03-30 ENCOUNTER — Encounter: Payer: Self-pay | Admitting: Obstetrics and Gynecology

## 2024-03-30 ENCOUNTER — Ambulatory Visit: Admitting: Podiatry

## 2024-03-30 VITALS — Ht 60.0 in | Wt 188.6 lb

## 2024-03-30 DIAGNOSIS — M722 Plantar fascial fibromatosis: Secondary | ICD-10-CM | POA: Diagnosis not present

## 2024-03-31 NOTE — Progress Notes (Signed)
  Subjective:  Patient ID: Hannah Rodriguez, female    DOB: 08-Jun-1981,  MRN: [id number]  Chief Complaint  Patient presents with   Plantar Fasciitis    RM 21 Return in about 6 weeks (around 03/16/2024) for recheck plantar fasciitis. Pt states pain after prolonged standing and walking.    Discussed the use of AI scribe software for clinical note transcription with the patient, who gave verbal consent to proceed.  History of Present Illness Hannah Rodriguez is a 43 year old female who presents with right heel pain.  She notes quite a bit of improvement      Objective:    Physical Exam VASCULAR: DP and PT pulse palpable. Foot is warm and well-perfused. Capillary fill time is brisk. DERMATOLOGIC: Normal skin turgor, texture, and temperature. No open lesions, rashes, or ulcerations. NEUROLOGIC: Normal sensation to light touch and pressure. No paresthesias. ORTHOPEDIC: Today only mild pain on the medial plantar heel   No images are attached to the encounter.    Results RADIOLOGY Right foot radiograph: Moderate-sized heel spur on plantar calcaneus without fracture or stress fracture (02/03/2024)   Assessment:   1. Plantar fasciitis of right foot      Plan:  Patient was evaluated and treated and all questions answered.  Assessment and Plan Assessment & Plan Right foot plantar fasciitis Significant proved meant with anti-inflammatories OTC orthotics and corticosteroid injection.  Do not need further injection this point I recommend she continue her home physical therapy plan and supporting.  Follow-up with me.  For this if it returns or worsens.

## 2024-04-02 NOTE — Progress Notes (Signed)
 TC. No answer. Left HIPAA compliant VM with request for call back and indication that MyChart message would be sent. Detailed MyChart message sent with office number, request for call to schedule colposcopy and education on the procedure.

## 2024-04-21 ENCOUNTER — Ambulatory Visit: Admitting: Obstetrics and Gynecology

## 2024-04-21 ENCOUNTER — Other Ambulatory Visit (HOSPITAL_COMMUNITY)
Admission: RE | Admit: 2024-04-21 | Discharge: 2024-04-21 | Disposition: A | Discharge status: Home / Self Care | Source: Ambulatory Visit | Attending: Obstetrics and Gynecology | Admitting: Obstetrics and Gynecology

## 2024-04-21 ENCOUNTER — Encounter: Payer: Self-pay | Admitting: Obstetrics and Gynecology

## 2024-04-21 VITALS — BP 130/87 | HR 81 | Ht 60.0 in | Wt 194.0 lb

## 2024-04-21 DIAGNOSIS — R87810 Cervical high risk human papillomavirus (HPV) DNA test positive: Secondary | ICD-10-CM | POA: Diagnosis not present

## 2024-04-21 DIAGNOSIS — N841 Polyp of cervix uteri: Secondary | ICD-10-CM | POA: Insufficient documentation

## 2024-04-21 DIAGNOSIS — R87610 Atypical squamous cells of undetermined significance on cytologic smear of cervix (ASC-US): Secondary | ICD-10-CM | POA: Insufficient documentation

## 2024-04-21 NOTE — Progress Notes (Signed)
    GYNECOLOGY CLINIC COLPOSCOPY PROCEDURE NOTE  43 y.o. [id number] here for colposcopy for pap finding of:  Result Date Procedure Results Follow-ups  03/25/2024 Cytology - PAP( [place]) High risk HPV: Positive (A) HPV 16: Negative HPV 18 / 45: Negative Adequacy: Satisfactory for evaluation; transformation zone component PRESENT. Diagnosis: - Atypical squamous cells of undetermined significance (ASC-US ) (A) Microorganisms: Shift in flora suggestive of bacterial vaginosis Comment: Normal Reference Range HPV - Negative Comment: Normal Reference Range HPV 16- Negative Comment: Normal Reference Range HPV 16 18 45 -Negative     Discussed role for HPV in cervical dysplasia, need for surveillance, nature of the procedure, and risks and benefits.  Pregnancy test: Lab Results  Component Value Date   PREGTESTUR NEGATIVE 12/10/2023    Allergies  Allergen Reactions   Codeine  Nausea And Vomiting   Latex Other (See Comments)    Vaginal sensitivity    Patient given informed consent, signed copy in the chart, time out was performed.    Placed in lithotomy position. Cervix viewed with speculum and colposcope after application of acetic acid.   Colposcopy Adequacy Cervix fully visualized: Yes  SCJ fully visualized: No 2/2 large polyp or fibroid in cervical os  Colposcopy Findings Mild acetowhite lesion(s) noted at 10-11 o'clock  Corresponding biopsies were obtained.    ECC specimen was not obtained.  All specimens were labeled and sent to pathology.  Hemostatic measures: Monsel's solution  Complications: none  Patient tolerated the procedure well.  OBGyn Exam  Colposcopy Impressions Low grade features   After colposcopy was completed cervical polyp was removed. Polypoid lesion noted in the ectocervix with narrow stalk.  CERVICAL POLYPECTOMY NOTE Risks of the biopsy including pain, bleeding, infection, inadequate specimen, and need for additional procedures were  discussed. The patient stated understanding and agreed to undergo procedure today. Consent was signed,  time out performed.  The patient's cervix was prepped with Betadine.  Ring forceps were used to grasp the polypoid lesion and the polypoid lesions was removed by twisting it off its base.  Tissue sent to pathology for analysis.  Small bleeding was noted and hemostasis was achieved using monsel's solution.  The patient tolerated the procedure well. Post-procedure instructions were given to the patient. The patient is to call with heavy bleeding, fever greater than 100.4, foul smelling vaginal discharge or other concerns. The patient will be return to clinic in two weeks for discussion of results.  Plan Treatment plan will be dependent on path results  Patient was given post procedure instructions.  Will follow up pathology and manage accordingly; patient will be contacted with results and recommendations.  Routine preventative health maintenance measures emphasized.   Jerilynn DELENA Buddle, MD

## 2024-04-21 NOTE — Progress Notes (Addendum)
 43 y.o. GYN presents for COLPO.  HIGH RISK HPV ([place]): Positive Abnormal  YPV 16 ([place]): Negative HPV 18 / 45 ([place]): Negative ADEQUACY: Satisfactory for evaluation; transformation zone component PRESENT. DIAGNOSIS: - Atypical squamous cells of undetermined significance (ASC-US ) Abnormal    UPT  Negative

## 2024-04-22 ENCOUNTER — Ambulatory Visit: Payer: Self-pay | Admitting: Obstetrics and Gynecology

## 2024-04-22 LAB — SURGICAL PATHOLOGY

## 2024-06-23 ENCOUNTER — Ambulatory Visit
Admission: EM | Admit: 2024-06-23 | Discharge: 2024-06-23 | Disposition: A | Discharge status: Home / Self Care | Attending: Family Medicine | Admitting: Family Medicine

## 2024-06-23 DIAGNOSIS — J309 Allergic rhinitis, unspecified: Secondary | ICD-10-CM

## 2024-06-23 DIAGNOSIS — J4530 Mild persistent asthma, uncomplicated: Secondary | ICD-10-CM

## 2024-06-23 DIAGNOSIS — J988 Other specified respiratory disorders: Secondary | ICD-10-CM

## 2024-06-23 DIAGNOSIS — B9789 Other viral agents as the cause of diseases classified elsewhere: Secondary | ICD-10-CM

## 2024-06-23 DIAGNOSIS — R070 Pain in throat: Secondary | ICD-10-CM | POA: Diagnosis not present

## 2024-06-23 MED ORDER — PSEUDOEPHEDRINE HCL 60 MG PO TABS: 60.0000 mg | ORAL_TABLET | Freq: Three times a day (TID) | ORAL | 0 refills | Status: AC | PRN

## 2024-06-23 NOTE — ED Triage Notes (Signed)
 Pt c/o cough, head/chest congestion, sore throat, sinus pain/pressure x 5 days-some relief with nyquil

## 2024-06-23 NOTE — ED Provider Notes (Signed)
 " [facility]  Note:  This document was prepared using Conservation officer, historic buildings and may include unintentional dictation errors.  MRN: [id number] DOB: 1980-11-11  Subjective:   Hannah Rodriguez is a 43 y.o. female presenting for 5-day history of sinus congestion, sinus pressure, sinus drainage, throat pain, dry hacking cough that elicits chest discomfort, chest congestion.  Has a history of asthma.  Patient smokes 1 PPD.  Has previously used steroids but no longer wants to use them as the last time she did, had significant side effects.  Current Outpatient Medications  Medication Instructions   albuterol  (PROVENTIL ) 2.5 mg, Nebulization, Every 4 hours PRN   albuterol  (VENTOLIN  HFA) 108 (90 Base) MCG/ACT inhaler 2 puffs, Inhalation, Every 4 hours PRN   ALPRAZolam (XANAX) 0.5 mg, Every 6 hours PRN   atorvastatin (LIPITOR) 20 mg, Daily   cetirizine (ZYRTEC) 10 mg, Every evening   clindamycin  (CLEOCIN  T) 1 % external solution Topical, 2 times daily    Allergies[1]  Past Medical History:  Diagnosis Date   Anxiety    Asthma    Degenerative disc disease, lumbar    Depression    Migraine      Past Surgical History:  Procedure Laterality Date   INDUCED ABORTION     NO PAST SURGERIES      Family History  Problem Relation Age of Onset   Heart disease Mother    Cancer - Lung Mother    Heart disease Maternal Grandmother    Cancer Maternal Uncle     Social History   Occupational History   Not on file  Tobacco Use   Smoking status: Every Day    Current packs/day: 1.00    Types: Cigarettes   Smokeless tobacco: Never  Vaping Use   Vaping status: Never Used  Substance and Sexual Activity   Alcohol use: Not Currently   Drug use: No   Sexual activity: Not on file     ROS   Objective:   Vitals: BP 126/86 (BP Location: Left Arm)   Pulse 62   Temp 98.2 F (36.8 C) (Oral)   Resp 16   LMP 06/21/2024   SpO2 98%   Physical  Exam Constitutional:      General: She is not in acute distress.    Appearance: Normal appearance. She is well-developed and normal weight. She is not ill-appearing, toxic-appearing or diaphoretic.  HENT:     Head: Normocephalic and atraumatic.     Right Ear: Tympanic membrane, ear canal and external ear normal. No drainage or tenderness. No middle ear effusion. There is no impacted cerumen. Tympanic membrane is not erythematous or bulging.     Left Ear: Tympanic membrane, ear canal and external ear normal. No drainage or tenderness.  No middle ear effusion. There is no impacted cerumen. Tympanic membrane is not erythematous or bulging.     Nose: Congestion and rhinorrhea present.     Mouth/Throat:     Mouth: Mucous membranes are moist. No oral lesions.     Pharynx: No pharyngeal swelling, oropharyngeal exudate, posterior oropharyngeal erythema or uvula swelling.     Tonsils: No tonsillar exudate or tonsillar abscesses.  Eyes:     General: No scleral icterus.       Right eye: No discharge.        Left eye: No discharge.     Extraocular Movements: Extraocular movements intact.     Right eye: Normal extraocular motion.     Left  eye: Normal extraocular motion.     Conjunctiva/sclera: Conjunctivae normal.  Cardiovascular:     Rate and Rhythm: Normal rate and regular rhythm.     Heart sounds: Normal heart sounds. No murmur heard.    No friction rub. No gallop.  Pulmonary:     Effort: Pulmonary effort is normal. No respiratory distress.     Breath sounds: No stridor. No wheezing, rhonchi or rales.  Chest:     Chest wall: No tenderness.  Musculoskeletal:     Cervical back: Normal range of motion and neck supple.  Lymphadenopathy:     Cervical: No cervical adenopathy.  Skin:    General: Skin is warm and dry.  Neurological:     General: No focal deficit present.     Mental Status: She is alert and oriented to person, place, and time.  Psychiatric:        Mood and Affect: Mood normal.         Behavior: Behavior normal.     Assessment and Plan :   PDMP not reviewed this encounter.  1. Viral respiratory infection   2. Throat pain   3. Allergic rhinitis, unspecified seasonality, unspecified trigger   4. Mild persistent asthma without complication      Biotic stewardship given timeline of illness.  Emphasized need to quit smoking as this is exacerbating her recovery and aggravating and allergic rhinitis whilst undergoing a viral illness.  Patient refused steroid treatment.  Recommend supportive care.  Deferred imaging given clear cardiopulmonary exam, hemodynamically stable vital signs.  Counseled patient on potential for adverse effects with medications prescribed/recommended today, ER and return-to-clinic precautions discussed, patient verbalized understanding.     [1]  Allergies Allergen Reactions   Codeine  Nausea And Vomiting   Latex Other (See Comments)    Vaginal sensitivity     Christopher Savannah, PA-C 06/23/24 1857  "

## 2024-06-23 NOTE — Discharge Instructions (Addendum)
 We will manage this as a viral respiratory illness. For sore throat or cough try using a honey-based tea. Use 3 teaspoons of honey with juice squeezed from half lemon. Place shaved pieces of ginger into 1/2-1 cup of water and warm over stove top. Then mix the ingredients and repeat every 4 hours as needed. Please take ibuprofen  800mg  every 8 hours with food alternating with OR taken together with Tylenol  500mg -650mg  every 6 hours for throat pain, fevers, aches and pains. Hydrate very well with at least 2 liters of water. Eat light meals such as soups (chicken and noodles, vegetable, chicken and wild rice).  Do not eat foods that you are allergic to.  Taking an antihistamine like Zyrtec (10mg  daily) can help against postnasal drainage, sinus congestion which can cause sinus pain, sinus headaches, throat pain, painful swallowing, coughing.  You can take this together with pseudoephedrine (Sudafed) at a dose of 60 mg 3 times a day or twice daily as needed for the same kind of nasal drip, congestion.  Hold off on smoking as much as possible.

## 2024-06-26 NOTE — Progress Notes (Signed)
 "    06/29/2024 RAMI WADDLE [id number] March 02, 1981  Referring provider: Delores Nidia LITTIE, FNP Primary GI doctor: Dr. Suzann  ASSESSMENT AND PLAN:  Chronic constipation Chronic, severe, refractory constipation with altered bowel habits, rectal pain, and bleeding, raising concern for underlying pathology. - Performed rectal examination revealing hard stool without external fissures or significant external hemorrhoids. - Provided education on proper defecation techniques and dietary fiber supplementation. - Recommended daily polyethylene glycol (Miralax) for bowel regulation; advised against ineffective stool softeners such as docusate sodium  (Colace). - Scheduled colonoscopy to evaluate for underlying pathology given change in bowel habits and rectal bleeding. - Discussed potential escalation to prescription therapy (linaclotide) if Miralax is ineffective. - check CBC, CMET, ESR, CRP, celiac panel  Internal hemorrhoids Likely internal hemorrhoids based on rectal examination and history of intermittent hematochezia, exacerbated by constipation and improper defecation technique. - Provided education on hemorrhoidal disease, including pathophysiology and contributing factors. - Emphasized management through constipation control and proper defecation technique. - Reviewed indications for further interventions (suppositories, rubber band ligation) if symptoms persist or worsen after colonoscopy.  Gallstones Incidentally identified 1.8 cm cholelithiasis on prior imaging, currently asymptomatic and not requiring intervention. - Provided education regarding gallstone disease, potential symptoms, and indications for seeking care if symptoms develop. - Recommended low-fat diet as preventive measure. - Documented cholelithiasis in medical record.  Pelvic floor dysfunction Increased risk due to multiparity (six vaginal deliveries), likely contributing to constipation and associated symptoms; no  current pelvic floor therapy. - Provided education on pelvic floor dysfunction and its impact on bowel, urinary, and sexual function. - Recommended consideration of pelvic floor physical therapy and provided information on therapy options, noting potential benefit for constipation and associated symptoms.  Patient Care Team: Shelda Atlas, MD as PCP - General (Internal Medicine)  HISTORY OF PRESENT ILLNESS: 44 y.o. female with a past medical history listed below presents for evaluation of constipation.   Discussed the use of AI scribe software for clinical note transcription with the patient, who gave verbal consent to proceed.  History of Present Illness   ANNAMARIE YAMAGUCHI is a 44 year old female with chronic constipation who presents for evaluation of chronic changes in bowel habits and rectal bleeding.  Over the past year, she has experienced worsening constipation with infrequent bowel movements occurring every other day or less, often requiring magnesium citrate for relief during severe episodes. Stool is typically hard and difficult to pass, leading to a sensation of incomplete evacuation and severe bloating. Abdominal pain, particularly after eating, and occasional nocturnal pain requiring her to wake up and clean herself have been reported. Nausea is present at times, but she denies vomiting. A notable episode in May involved significant discomfort and near-syncope after passing a large, hard stool. She has tried generic stool softeners in the past without benefit and does not use regular stool softeners, fiber supplements, or other medications for constipation between episodes.  Bright red blood has been noted on toilet paper a few times, without blood in the toilet bowl. Rectal pain occurs with severe constipation and passage of large, hard stool. She denies rectal burning or itching. No fevers, chills, or recent medication changes. Ibuprofen  is used as needed for pain. No history of  abdominal surgeries or C-sections.  Family history is notable for her mother having one or two colon polyps removed approximately 15 years ago, described as possibly precancerous or adenomatous. Her mother underwent more frequent colonoscopies in the past, which were discontinued after developing heart attacks  and lung cancer. No known family history of colon cancer or autoimmune disease on her mother's side. Paternal medical history is unknown.  Gallstones were incidentally found on a CT renal scan in September 2023, with a 1.8 cm stone identified.  Obstetric history includes six vaginal deliveries. Past medical history includes asthma and hyperlipidemia, for which she takes atorvastatin. She uses tobacco and denies alcohol or illicit drug use. No dysphagia, chest pain, or sensation of food or pills getting stuck.        She  reports that she has been smoking cigarettes. She has never used smokeless tobacco. She reports that she does not currently use alcohol. She reports that she does not use drugs.  RELEVANT GI HISTORY, IMAGING AND LABS: Results   Radiology CT renal (02/2022): Cholelithiasis with largest calculus measuring 1.8 cm within the gallbladder      CBC    Component Value Date/Time   WBC 5.9 12/10/2023 1232   RBC 4.53 12/10/2023 1232   HGB 13.7 12/10/2023 1232   HCT 41.0 12/10/2023 1232   PLT 240 12/10/2023 1232   MCV 90.5 12/10/2023 1232   MCH 30.2 12/10/2023 1232   MCHC 33.4 12/10/2023 1232   RDW 12.8 12/10/2023 1232   LYMPHSABS 1.8 12/10/2023 1232   MONOABS 0.4 12/10/2023 1232   EOSABS 0.2 12/10/2023 1232   BASOSABS 0.1 12/10/2023 1232   Recent Labs    12/10/23 1232  HGB 13.7    CMP     Component Value Date/Time   NA 137 12/10/2023 1232   K 5.2 (H) 12/10/2023 1232   CL 105 12/10/2023 1232   CO2 23 12/10/2023 1232   GLUCOSE 89 12/10/2023 1232   BUN 9 12/10/2023 1232   CREATININE 0.58 12/10/2023 1232   CALCIUM 9.5 12/10/2023 1232   PROT 6.8 12/10/2023  1232   ALBUMIN 4.2 12/10/2023 1232   AST 14 (L) 12/10/2023 1232   ALT 6 12/10/2023 1232   ALKPHOS 92 12/10/2023 1232   BILITOT 0.4 12/10/2023 1232   GFRNONAA >60 12/10/2023 1232   GFRAA >60 12/07/2016 1008      Latest Ref Rng & Units 12/10/2023   12:32 PM 03/19/2022    8:17 AM 12/07/2016   10:08 AM  Hepatic Function  Total Protein 6.5 - 8.1 g/dL 6.8  6.3  7.0   Albumin 3.5 - 5.0 g/dL 4.2  4.0  4.3   AST 15 - 41 U/L 14  14  17    ALT 0 - 44 U/L 6  14  12    Alk Phosphatase 38 - 126 U/L 92  55  67   Total Bilirubin 0.0 - 1.2 mg/dL 0.4  0.5  0.7   Bilirubin, Direct 0.0 - 0.2 mg/dL  0.1        Current Medications:   Current Outpatient Medications (Cardiovascular):    atorvastatin (LIPITOR) 20 MG tablet, Take 20 mg by mouth daily.  Current Outpatient Medications (Respiratory):    albuterol  (PROVENTIL ) (2.5 MG/3ML) 0.083% nebulizer solution, Take 3 mLs (2.5 mg total) by nebulization every 4 (four) hours as needed for wheezing or shortness of breath.   albuterol  (VENTOLIN  HFA) 108 (90 Base) MCG/ACT inhaler, Inhale 2 puffs into the lungs every 4 (four) hours as needed for wheezing or shortness of breath.   cetirizine (ZYRTEC) 10 MG tablet, Take 10 mg by mouth every evening.   pseudoephedrine  (SUDAFED) 60 MG tablet, Take 1 tablet (60 mg total) by mouth every 8 (eight) hours as needed for congestion.  Current Outpatient Medications (Analgesics):    ibuprofen  (ADVIL ) 800 MG tablet, Take 800 mg by mouth 3 (three) times daily as needed.  Current Outpatient Medications (Other):    ALPRAZolam (XANAX) 0.5 MG tablet, Take 0.5 mg by mouth every 6 (six) hours as needed.   clindamycin  (CLEOCIN  T) 1 % external solution, Apply topically 2 (two) times daily.   Na Sulfate-K Sulfate-Mg Sulfate concentrate (SUPREP BOWEL PREP KIT) 17.5-3.13-1.6 GM/177ML SOLN, Take 1 kit (354 mLs total) by mouth once for 1 dose.   Vitamin D, Ergocalciferol, (DRISDOL) 1.25 MG (50000 UNIT) CAPS capsule, Take 50,000 Units by  mouth once a week.  Medical History:  Past Medical History:  Diagnosis Date   Anxiety    Asthma    Degenerative disc disease, lumbar    Depression    Migraine    Allergies: Allergies[1]   Surgical History:  She  has a past surgical history that includes No past surgeries and Induced abortion. Family History:  Her family history includes Alzheimer's disease in her mother; Anxiety disorder in her daughter; Asthma in her brother; Cancer in her maternal uncle; Cancer - Lung in her mother; Colon polyps in her mother; Dementia in her mother; Diabetes in her brother; Heart attack in her mother; Heart disease in her maternal grandmother and mother; Skin cancer in her maternal grandmother and maternal uncle.  REVIEW OF SYSTEMS  : All other systems reviewed and negative except where noted in the History of Present Illness.  PHYSICAL EXAM: BP 130/76 (BP Location: Left Arm, Patient Position: Sitting, Cuff Size: Large)   Pulse 72   Ht 5' 1.5 (1.562 m) Comment: height measured without shoes  Wt 198 lb 8 oz (90 kg)   LMP 06/21/2024   BMI 36.90 kg/m  Physical Exam   GENERAL APPEARANCE: Well nourished, in no apparent distress. HEENT: No cervical lymphadenopathy, unremarkable thyroid, sclerae anicteric, conjunctiva pink. RESPIRATORY: Respiratory effort normal, breath sounds equal bilaterally without rales, rhonchi, or wheezing. CARDIO: Regular rate and rhythm with no murmurs, rubs, or gallops, peripheral pulses intact. ABDOMEN: Soft, non-distended, active bowel sounds in all four quadrants, no tenderness to palpation, no rebound, no mass appreciated. RECTAL: No anal fissures, hemorrhoidal skin tag posteriorly, hard stool present, stool hemoccult negative. MUSCULOSKELETAL: Full range of motion, normal gait, without edema. SKIN: Dry, intact without rashes or lesions. No jaundice. NEURO: Alert, oriented, no focal deficits. PSYCH: Cooperative, normal mood and affect.      Alan JONELLE Coombs,  PA-C 12:03 PM      [1]  Allergies Allergen Reactions   Codeine  Nausea And Vomiting   Latex Other (See Comments)    Vaginal sensitivity   "

## 2024-06-29 ENCOUNTER — Encounter: Payer: Self-pay | Admitting: Physician Assistant

## 2024-06-29 ENCOUNTER — Other Ambulatory Visit

## 2024-06-29 ENCOUNTER — Ambulatory Visit (INDEPENDENT_AMBULATORY_CARE_PROVIDER_SITE_OTHER): Admitting: Physician Assistant

## 2024-06-29 VITALS — BP 130/76 | HR 72 | Ht 61.5 in | Wt 198.5 lb

## 2024-06-29 DIAGNOSIS — K5909 Other constipation: Secondary | ICD-10-CM

## 2024-06-29 DIAGNOSIS — K648 Other hemorrhoids: Secondary | ICD-10-CM

## 2024-06-29 DIAGNOSIS — R194 Change in bowel habit: Secondary | ICD-10-CM | POA: Diagnosis not present

## 2024-06-29 DIAGNOSIS — M9905 Segmental and somatic dysfunction of pelvic region: Secondary | ICD-10-CM | POA: Diagnosis not present

## 2024-06-29 DIAGNOSIS — K8020 Calculus of gallbladder without cholecystitis without obstruction: Secondary | ICD-10-CM

## 2024-06-29 LAB — CBC WITH DIFFERENTIAL/PLATELET
Basophils Absolute: 0 K/uL (ref 0.0–0.1)
Basophils Relative: 0.7 % (ref 0.0–3.0)
Eosinophils Absolute: 0.3 K/uL (ref 0.0–0.7)
Eosinophils Relative: 5.1 % — ABNORMAL HIGH (ref 0.0–5.0)
HCT: 38.3 % (ref 36.0–46.0)
Hemoglobin: 13 g/dL (ref 12.0–15.0)
Lymphocytes Relative: 36 % (ref 12.0–46.0)
Lymphs Abs: 2 K/uL (ref 0.7–4.0)
MCHC: 34 g/dL (ref 30.0–36.0)
MCV: 90.3 fl (ref 78.0–100.0)
Monocytes Absolute: 0.4 K/uL (ref 0.1–1.0)
Monocytes Relative: 7.6 % (ref 3.0–12.0)
Neutro Abs: 2.8 K/uL (ref 1.4–7.7)
Neutrophils Relative %: 50.6 % (ref 43.0–77.0)
Platelets: 239 K/uL (ref 150.0–400.0)
RBC: 4.24 Mil/uL (ref 3.87–5.11)
RDW: 13.2 % (ref 11.5–15.5)
WBC: 5.5 K/uL (ref 4.0–10.5)

## 2024-06-29 LAB — COMPREHENSIVE METABOLIC PANEL WITH GFR
ALT: 12 U/L (ref 3–35)
AST: 12 U/L (ref 5–37)
Albumin: 3.9 g/dL (ref 3.5–5.2)
Alkaline Phosphatase: 79 U/L (ref 39–117)
BUN: 10 mg/dL (ref 6–23)
CO2: 27 meq/L (ref 19–32)
Calcium: 8.6 mg/dL (ref 8.4–10.5)
Chloride: 107 meq/L (ref 96–112)
Creatinine, Ser: 0.52 mg/dL (ref 0.40–1.20)
GFR: 112.82 mL/min
Glucose, Bld: 78 mg/dL (ref 70–99)
Potassium: 4.4 meq/L (ref 3.5–5.1)
Sodium: 138 meq/L (ref 135–145)
Total Bilirubin: 0.3 mg/dL (ref 0.2–1.2)
Total Protein: 6.4 g/dL (ref 6.0–8.3)

## 2024-06-29 LAB — SEDIMENTATION RATE: Sed Rate: 4 mm/h (ref 0–20)

## 2024-06-29 LAB — TSH: TSH: 2.55 u[IU]/mL (ref 0.35–5.50)

## 2024-06-29 LAB — C-REACTIVE PROTEIN: CRP: <0.5 mg/dL — ABNORMAL LOW (ref 1.0–20.0)

## 2024-06-29 MED ORDER — NA SULFATE-K SULFATE-MG SULF 17.5-3.13-1.6 GM/177ML PO SOLN: 1.0000 | Freq: Once | ORAL | 0 refills | Status: AC

## 2024-06-29 NOTE — Patient Instructions (Addendum)
 Your provider has requested that you go to the basement level for lab work before leaving today. Press B on the elevator. The lab is located at the first door on the left as you exit the elevator.  You have been scheduled for a colonoscopy. Please follow written instructions given to you at your visit today.   If you use inhalers (even only as needed), please bring them with you on the day of your procedure.  DO NOT TAKE 7 DAYS PRIOR TO TEST- Trulicity (dulaglutide) Ozempic, Wegovy (semaglutide) Mounjaro, Zepbound (tirzepatide) Bydureon Bcise (exanatide extended release)  DO NOT TAKE 1 DAY PRIOR TO YOUR TEST Rybelsus (semaglutide) Adlyxin (lixisenatide) Victoza (liraglutide) Byetta (exanatide) ___________________________________________________________________________     HEMORRHOIDS  The rectum is the last foot of your colon, and it naturally stretches to hold stool. Hemorrhoidal piles are natural clusters of blood vessels that help the rectum and anal canal stretch to hold stool and allow bowel movements to eliminate feces.  Hemorrhoids are abnormally swollen blood vessels in the rectum. Too much pressure in the rectum causes hemorrhoids by forcing blood to stretch and bulge the walls of the veins, sometimes even rupturing them. Hemorrhoids can become like varicose veins you might see on a person's legs.  Most people will develop a flare of hemorrhoids in their lifetime. When bulging hemorrhoidal veins are irritated, they can swell, burn, itch, cause pain, and bleed. Most flares will calm down gradually own within a few weeks. However, once hemorrhoids are created, they are difficult to get rid of completely and tend to flare more easily than the first flare. Fortunately, good habits and simple medical treatment usually control hemorrhoids well, and surgery is needed only in severe cases.  Types of Hemorrhoids:  Internal hemorrhoids usually don't initially hurt or itch; they are deep  inside the rectum and usually have no sensation. If they begin to push out (prolapse), pain and burning can occur. However, internal hemorrhoids can bleed. Anal bleeding should not be ignored since bleeding could come from a dangerous source like colorectal cancer, so persistent rectal bleeding should be investigated by a doctor, sometimes with a colonoscopy.  External hemorrhoids cause most of the symptoms - pain, burning, and itching. Nonirritated hemorrhoids can look like small skin tags coming out of the anus.  Thrombosed hemorrhoids can form when a hemorrhoid blood vessel bursts and causes the hemorrhoid to suddenly swell. A purple blood clot can form in it and become an excruciatingly painful lump at the anus. Because of these unpleasant symptoms, immediate incision and drainage by a surgeon at an office visit can provide much relief of the pain.   PREVENTION Avoiding the most frequent causes listed below will prevent most cases of hemorrhoids: Constipation Hard stools Diarrhea  Constant sitting  Straining with bowel movements Sitting on the toilet for a long time  Severe coughing episodes Pregnancy / Childbirth  Heavy Lifting   Sometimes avoiding the above triggers is difficult: How can you avoid sitting all day if you have a seated job? Also, we try to avoid coughing and diarrhea, but sometimes its beyond your control. Still, there are some practical hints to help: Keep the anal and genital area clean. Moistened tissues such as flushable wet wipes are less irritating than toilet paper. Using irrigating showers or bottle irrigation washing gently cleans this sensitive area. Avoid dry toilet paper when cleaning after bowel movements. SABRA Keep the anal and genital area dry. Lightly pat the rectal area dry. Avoid rubbing. Use cotton  pads/balls to gently absorb moisture/leaking. Talcum or baby powders can help GET YOUR STOOLS SOFT. This is the most important way to prevent irritated hemorrhoids.  Hard stools are like sandpaper to the anorectal canal and will cause more problems.   The goal: ONE SOFT BOWEL MOVEMENT A DAY!  BMs from every other day to three times a day is a tolerable range  Treat coughing, diarrhea and constipation early since irritated hemorrhoids may soon follow.   If your main job activity is seated, always stand or walk during your breaks. Make it a point to stand and walk at least 5 minutes every hour and try to shift frequently in your chair to avoid direct rectal pressure.   Always exhale as you strain or lift. Don't hold your breath.   Do not delay or try to prevent a bowel movement when the urge is present.  Exercise regularly (walking or jogging 60 minutes a day) to stimulate the bowels to move.  No reading or other activity while on the toilet.    Miralax is an osmotic laxative.  It only brings more water into the stool.  This is safe to take daily.  Can take up to 17 gram of miralax twice a day.  Mix with juice or coffee.  Start 1 capful for 3-4 days and reassess your response in 3-4 days.  You can increase and decrease the dose based on your response.  Remember, it can take up to 3-4 days to take effect OR for the effects to wear off.   I often pair this with benefiber in the morning to help assure the stool is not too loose.   FIBER SUPPLEMENT You can do metamucil or fibercon once or twice a day but if this causes gas/bloating please switch to Benefiber or Citracel.  Fiber is good for constipation/diarrhea/irritable bowel syndrome.  It can also help with weight loss and can help lower your bad cholesterol (LDL).  Please do 1 TBSP in the morning in water, coffee, or tea.  It can take up to a month before you can see a difference with your bowel movements.  It is cheapest from [employer], [employer], [employer].   Toileting tips to help with your constipation - Drink at least 64-80 ounces of water/liquid per day. - Establish a time to try to move your  bowels every day.  For many people, this is after a cup of coffee or after a meal such as breakfast. - Sit all of the way back on the toilet keeping your back fairly straight and while sitting up, try to rest the tops of your forearms on your upper thighs.   - Raising your feet with a step stool/squatty potty can be helpful to improve the angle that allows your stool to pass through the rectum. - Relax the rectum feeling it bulge toward the toilet water.  If you feel your rectum raising toward your body, you are contracting rather than relaxing. - Breathe in and slowly exhale. Belly breath by expanding your belly towards your belly button. Keep belly expanded as you gently direct pressure down and back to the anus.  A low pitched GRRR sound can assist with increasing intra-abdominal pressure.  (Can also trying to blow on a pinwheel and make it move, this helps with the same belly breathing) - Repeat 3-4 times. If unsuccessful, contract the pelvic floor to restore normal tone and get off the toilet.  Avoid excessive straining. - To reduce excessive wiping by teaching  your anus to normally contract, place hands on outer aspect of knees and resist knee movement outward.  Hold 5-10 second then place hands just inside of knees and resist inward movement of knees.  Hold 5 seconds.  Repeat a few times each way.  Go to the ER if unable to pass gas, severe AB pain, unable to hold down food, any shortness of breath of chest pain.   Here some information about pelvic floor dysfunction. This may be contributing to some of your symptoms. We will continue with our evaluation but I do want you to consider adding on fiber supplement with low-dose MiraLAX daily. We could also refer to pelvic floor physical therapy.   Pelvic Floor Dysfunction, Female Pelvic floor dysfunction (PFD) is a condition that results when the group of muscles and connective tissues that support the organs in the pelvis (pelvic floor  muscles) do not work well. These muscles and their connections form a sling that supports the colon and bladder. In women, they also support the uterus. PFD causes pelvic floor muscles to be too weak, too tight, or both. In PFD, muscle movements are not coordinated. This may cause bowel or bladder problems. It may also cause pain. What are the causes? This condition may be caused by an injury to the pelvic area or by a weakening of pelvic muscles. This often results from pregnancy and childbirth or other types of strain. In many cases, the exact cause is not known. What increases the risk? The following factors may make you more likely to develop this condition: Having chronic bladder tissue inflammation (interstitial cystitis). Being an older person. Being overweight. History of radiation treatment for cancer in the pelvic region. Previous pelvic surgery, such as removal of the uterus (hysterectomy). What are the signs or symptoms? Symptoms of this condition vary and may include: Bladder symptoms, such as: Trouble starting urination and emptying the bladder. Frequent urinary tract infections. Leaking urine when coughing, laughing, or exercising (stress incontinence). Having to pass urine urgently or frequently. Pain when passing urine. Bowel symptoms, such as: Constipation. Urgent or frequent bowel movements. Incomplete bowel movements. Painful bowel movements. Leaking stool or gas. Unexplained genital or rectal pain. Genital or rectal muscle spasms. Low back pain. Other symptoms may include: A heavy, full, or aching feeling in the vagina. A bulge that protrudes into the vagina. Pain during or after sex. How is this diagnosed? This condition may be diagnosed based on: Your symptoms and medical history. A physical exam. During the exam, your health care provider may check your pelvic muscles for tightness, spasm, pain, or weakness. This may include a rectal exam and a pelvic  exam. In some cases, you may have diagnostic tests, such as: Electrical muscle function tests. Urine flow testing. X-ray tests of bowel function. Ultrasound of the pelvic organs. How is this treated? Treatment for this condition depends on the symptoms. Treatment options include: Physical therapy. This may include Kegel exercises to help relax or strengthen the pelvic floor muscles. Biofeedback. This type of therapy provides feedback on how tight your pelvic floor muscles are so that you can learn to control them. Internal or external massage therapy. A treatment that involves electrical stimulation of the pelvic floor muscles to help control pain (transcutaneous electrical nerve stimulation, or TENS). Sound wave therapy (ultrasound) to reduce muscle spasms. Medicines, such as: Muscle relaxants. Bladder control medicines. Surgery to reconstruct or support pelvic floor muscles may be an option if other treatments do not help. Follow these  instructions at home: Activity Do your usual activities as told by your health care provider. Ask your health care provider if you should modify any activities. Do pelvic floor strengthening or relaxing exercises at home as told by your physical therapist. Lifestyle Maintain a healthy weight. Eat foods that are high in fiber, such as beans, whole grains, and fresh fruits and vegetables. Limit foods that are high in fat and processed sugars, such as fried or sweet foods. Manage stress with relaxation techniques such as yoga or meditation. General instructions If you have problems with leakage: Use absorbable pads or wear padded underwear. Wash frequently with mild soap. Keep your genital and anal area as clean and dry as possible. Ask your health care provider if you should try a barrier cream to prevent skin irritation. Take warm baths to relieve pelvic muscle tension or spasms. Take over-the-counter and prescription medicines only as told by your  health care provider. Keep all follow-up visits. How is this prevented? The cause of PFD is not always known, but there are a few things you can do to reduce the risk of developing this condition, including: Staying at a healthy weight. Getting regular exercise. Managing stress. Contact a health care provider if: Your symptoms are not improving with home care. You have signs or symptoms of PFD that get worse at home. You develop new signs or symptoms. You have signs of a urinary tract infection, such as: Fever. Chills. Increased urinary frequency. A burning feeling when urinating. You have not had a bowel movement in 3 days (constipation). Summary Pelvic floor dysfunction results when the muscles and connective tissues in your pelvic floor do not work well. These muscles and their connections form a sling that supports your colon and bladder. In women, they also support the uterus. PFD may be caused by an injury to the pelvic area or by a weakening of pelvic muscles. PFD causes pelvic floor muscles to be too weak, too tight, or a combination of both. Symptoms may vary from person to person. In most cases, PFD can be treated with physical therapies and medicines. Surgery may be an option if other treatments do not help. This information is not intended to replace advice given to you by your health care provider. Make sure you discuss any questions you have with your health care provider. Document Revised: 10/19/2020 Document Reviewed: 10/19/2020 Elsevier Patient Education  2022 [employer].  Due to recent changes in healthcare laws, you may see the results of your imaging and laboratory studies on MyChart before your provider has had a chance to review them.  We understand that in some cases there may be results that are confusing or concerning to you. Not all laboratory results come back in the same time frame and the provider may be waiting for multiple results in order to interpret  others.  Please give us  48 hours in order for your provider to thoroughly review all the results before contacting the office for clarification of your results.    I appreciate the  opportunity to care for you  Thank You   [facility]

## 2024-06-30 ENCOUNTER — Ambulatory Visit: Payer: Self-pay | Admitting: Physician Assistant

## 2024-06-30 LAB — IGA: Immunoglobulin A: 91 mg/dL (ref 47–310)

## 2024-06-30 LAB — TISSUE TRANSGLUTAMINASE, IGA: (tTG) Ab, IgA: <1 U/mL

## 2024-07-05 NOTE — Progress Notes (Unsigned)
 [facility] History and Physical   Primary Care Physician:  Shelda Atlas, MD   Reason for Procedure:  Chronic constipation, rectal bleeding, family history of colon polyps  Plan:    Colonoscopy   The patient was provided an opportunity to ask questions and all were answered. The patient agreed with the plan.   HPI: Hannah Rodriguez is a 44 y.o. female undergoing colonoscopy for investigation of chronic constipation, rectal bleeding and family history of colon polyps.  Patient reports a history of constipation that has worsened over the last year.  Bowel movements are infrequent occurring every other day often requiring magnesium citrate for relief.  Endorses sensation of incomplete evacuation.  Concern for pelvic floor dysfunction.  Bright red blood per rectum has been seen with the passage of large/hard stools.  No family history of colon cancer.  Patient's mother has had colon polyps.   Past Medical History:  Diagnosis Date   Anxiety    Asthma    Degenerative disc disease, lumbar    Depression    Migraine     Past Surgical History:  Procedure Laterality Date   INDUCED ABORTION     NO PAST SURGERIES      Prior to Admission medications  Medication Sig Start Date End Date Taking? Authorizing Provider  albuterol  (PROVENTIL ) (2.5 MG/3ML) 0.083% nebulizer solution Take 3 mLs (2.5 mg total) by nebulization every 4 (four) hours as needed for wheezing or shortness of breath. 04/26/22   Vonna Sharlet POUR, MD  albuterol  (VENTOLIN  HFA) 108 [id number] Base) MCG/ACT inhaler Inhale 2 puffs into the lungs every 4 (four) hours as needed for wheezing or shortness of breath. 04/26/22   Banister, Pamela K, MD  ALPRAZolam (XANAX) 0.5 MG tablet Take 0.5 mg by mouth every 6 (six) hours as needed. 08/03/22   [provider]  atorvastatin (LIPITOR) 20 MG tablet Take 20 mg by mouth daily.    [provider]  cetirizine (ZYRTEC) 10 MG tablet Take 10 mg by mouth every evening.     [provider]  clindamycin  (CLEOCIN  T) 1 % external solution Apply topically 2 (two) times daily. 03/25/24   Delores Nidia LITTIE, FNP  ibuprofen  (ADVIL ) 800 MG tablet Take 800 mg by mouth 3 (three) times daily as needed.    [provider]  pseudoephedrine  (SUDAFED) 60 MG tablet Take 1 tablet (60 mg total) by mouth every 8 (eight) hours as needed for congestion. 06/23/24   Christopher Savannah, PA-C  Vitamin D, Ergocalciferol, (DRISDOL) 1.25 MG (50000 UNIT) CAPS capsule Take 50,000 Units by mouth once a week.    [provider]    Current Outpatient Medications  Medication Sig Dispense Refill   albuterol  (PROVENTIL ) (2.5 MG/3ML) 0.083% nebulizer solution Take 3 mLs (2.5 mg total) by nebulization every 4 (four) hours as needed for wheezing or shortness of breath. 225 mL 0   albuterol  (VENTOLIN  HFA) 108 (90 Base) MCG/ACT inhaler Inhale 2 puffs into the lungs every 4 (four) hours as needed for wheezing or shortness of breath. 18 g 0   ALPRAZolam (XANAX) 0.5 MG tablet Take 0.5 mg by mouth every 6 (six) hours as needed.     atorvastatin (LIPITOR) 20 MG tablet Take 20 mg by mouth daily.     cetirizine (ZYRTEC) 10 MG tablet Take 10 mg by mouth every evening.     clindamycin  (CLEOCIN  T) 1 % external solution Apply topically 2 (two) times daily. 30 mL 1   ibuprofen  (ADVIL ) 800 MG tablet  Take 800 mg by mouth 3 (three) times daily as needed.     pseudoephedrine  (SUDAFED) 60 MG tablet Take 1 tablet (60 mg total) by mouth every 8 (eight) hours as needed for congestion. 30 tablet 0   Vitamin D, Ergocalciferol, (DRISDOL) 1.25 MG (50000 UNIT) CAPS capsule Take 50,000 Units by mouth once a week.     No current facility-administered medications for this visit.    Allergies as of 07/06/2024 - Review Complete 06/29/2024  Allergen Reaction Noted   Codeine  Nausea And Vomiting 02/23/2012   Latex Other (See Comments) 02/16/2014    Family History  Problem Relation Age of Onset   Heart disease  Mother    Cancer - Lung Mother    Colon polyps Mother    Alzheimer's disease Mother    Heart attack Mother    Dementia Mother    Diabetes Brother    Asthma Brother    Heart disease Maternal Grandmother    Skin cancer Maternal Grandmother    Cancer Maternal Uncle    Skin cancer Maternal Uncle    Anxiety disorder Daughter     Social History   Socioeconomic History   Marital status: Single    Spouse name: Not on file   Number of children: 6   Years of education: Not on file   Highest education level: Not on file  Occupational History   Not on file  Tobacco Use   Smoking status: Every Day    Current packs/day: 1.00    Types: Cigarettes   Smokeless tobacco: Never  Vaping Use   Vaping status: Never Used  Substance and Sexual Activity   Alcohol use: Not Currently   Drug use: No   Sexual activity: Not on file  Other Topics Concern   Not on file  Social History Narrative   Not on file   Social Drivers of Health   Tobacco Use: High Risk (06/29/2024)   Patient History    Smoking Tobacco Use: Every Day    Smokeless Tobacco Use: Never    Passive Exposure: Not on file  Financial Resource Strain: Not on file  Food Insecurity: Not on file  Transportation Needs: Not on file  Physical Activity: Not on file  Stress: Not on file  Social Connections: Not on file  Intimate Partner Violence: Not on file  Depression (EYV7-0): Medium Risk (03/25/2024)   Depression (PHQ2-9)    PHQ-2 Score: 7  Alcohol Screen: Not on file  Housing: Not on file  Utilities: Not on file  Health Literacy: Not on file    Review of Systems:  All other review of systems negative except as mentioned in the HPI.  Physical Exam: Vital signs LMP 06/21/2024   General:   Alert,  Well-developed, well-nourished, pleasant and cooperative in NAD Airway:  Mallampati  Lungs:  Clear throughout to auscultation.   Heart:  Regular rate and rhythm; no murmurs, clicks, rubs,  or gallops. Abdomen:  Soft, nontender  and nondistended. Normal bowel sounds.   Neuro/Psych:  Normal mood and affect. A and O x 3  Inocente Hausen, MD [facility]

## 2024-07-06 ENCOUNTER — Encounter: Admitting: Pediatrics

## 2024-07-06 ENCOUNTER — Ambulatory Visit: Admitting: Pediatrics

## 2024-07-06 ENCOUNTER — Encounter: Payer: Self-pay | Admitting: Pediatrics

## 2024-07-06 VITALS — BP 150/87 | HR 71 | Temp 97.9°F | Resp 19 | Ht 65.1 in | Wt 198.8 lb

## 2024-07-06 DIAGNOSIS — K621 Rectal polyp: Secondary | ICD-10-CM

## 2024-07-06 DIAGNOSIS — K5000 Crohn's disease of small intestine without complications: Secondary | ICD-10-CM | POA: Diagnosis not present

## 2024-07-06 DIAGNOSIS — K633 Ulcer of intestine: Secondary | ICD-10-CM

## 2024-07-06 DIAGNOSIS — D128 Benign neoplasm of rectum: Secondary | ICD-10-CM

## 2024-07-06 DIAGNOSIS — K625 Hemorrhage of anus and rectum: Secondary | ICD-10-CM

## 2024-07-06 DIAGNOSIS — K529 Noninfective gastroenteritis and colitis, unspecified: Secondary | ICD-10-CM | POA: Diagnosis not present

## 2024-07-06 DIAGNOSIS — K648 Other hemorrhoids: Secondary | ICD-10-CM | POA: Diagnosis not present

## 2024-07-06 DIAGNOSIS — K635 Polyp of colon: Secondary | ICD-10-CM

## 2024-07-06 DIAGNOSIS — R194 Change in bowel habit: Secondary | ICD-10-CM

## 2024-07-06 DIAGNOSIS — D123 Benign neoplasm of transverse colon: Secondary | ICD-10-CM

## 2024-07-06 DIAGNOSIS — Z83719 Family history of colon polyps, unspecified: Secondary | ICD-10-CM | POA: Diagnosis not present

## 2024-07-06 DIAGNOSIS — K5909 Other constipation: Secondary | ICD-10-CM | POA: Diagnosis not present

## 2024-07-06 MED ORDER — SODIUM CHLORIDE 0.9 % IV SOLN: 500.0000 mL | INTRAVENOUS | Status: DC

## 2024-07-06 NOTE — Progress Notes (Signed)
 Pt's states no medical or surgical changes since previsit or office visit.

## 2024-07-06 NOTE — Op Note (Signed)
 [facility] Patient Name: Hannah Rodriguez Procedure Date: 07/06/2024 8:56 AM MRN: [id number] Endoscopist: Inocente Hausen , MD, [id number] Age: 44 Referring MD:  Date of Birth: 1980-10-30 Gender: Female Account #: [id number] Procedure:                Colonoscopy Indications:              This is the patient's first colonoscopy, Rectal                            bleeding, Family history of colonic polyps in a                            first-degree relative, Constipation Medicines:                Monitored Anesthesia Care Procedure:                Pre-Anesthesia Assessment:                           - Prior to the procedure, a History and Physical                            was performed, and patient medications and                            allergies were reviewed. The patient's tolerance of                            previous anesthesia was also reviewed. The risks                            and benefits of the procedure and the sedation                            options and risks were discussed with the patient.                            All questions were answered, and informed consent                            was obtained. Prior Anticoagulants: The patient has                            taken no anticoagulant or antiplatelet agents. ASA                            Grade Assessment: II - A patient with mild systemic                            disease. After reviewing the risks and benefits,                            the patient was deemed in satisfactory condition to  undergo the procedure.                           After obtaining informed consent, the colonoscope                            was passed under direct vision. Throughout the                            procedure, the patient's blood pressure, pulse, and                            oxygen saturations were monitored continuously. The                            CF [id number] #[id number] was  introduced through the anus                            and advanced to the terminal ileum. The colonoscopy                            was performed without difficulty. The patient                            tolerated the procedure well. The quality of the                            bowel preparation was good. The terminal ileum,                            ileocecal valve, appendiceal orifice, and rectum                            were photographed. Scope In: 9:15:10 AM Scope Out: 9:32:55 AM Scope Withdrawal Time: 0 hours 14 minutes 51 seconds  Total Procedure Duration: 0 hours 17 minutes 45 seconds  Findings:                 The perianal and digital rectal examinations were                            normal. Pertinent negatives include normal                            sphincter tone and no palpable rectal lesions.                           Two sessile polyps were found in the rectum and                            hepatic flexure. The polyps were 5 to 6 mm in size.                            These polyps were removed with a cold snare.  Resection and retrieval were complete.                           A single localized erosion was found at the hepatic                            flexure. Biopsies were taken with a cold forceps                            for histology.                           Normal mucosa was found in the entire colon with                            the exception of the isolated erosion seen at the                            hepatic flexure. Biopsies were taken with a cold                            forceps for histology.                           Scattered mild inflammation characterized by                            erosions and erythema was found in the terminal                            ileum. Biopsies were taken with a cold forceps for                            histology.                           Internal hemorrhoids were found during  retroflexion. Complications:            No immediate complications. Estimated blood loss:                            Minimal. Estimated Blood Loss:     Estimated blood loss was minimal. Impression:               - Two 5 to 6 mm polyps in the rectum and at the                            hepatic flexure, removed with a cold snare.                            Resected and retrieved.                           - A single erosion at the hepatic flexure. Biopsied.                           -  Normal mucosa in the entire examined colon with                            the exception of isolated erosion at the hepatic                            flexure. Biopsied.                           - Mild inflammation was found in the ileum.                            Biopsied. Differential diagnosis may include NSAID                            effect, medications, bowel prep artifact, IBD.                           - Internal hemorrhoids. Recommendation:           - Discharge patient to home (ambulatory).                           - Await pathology results.                           - Repeat colonoscopy for surveillance based on                            pathology results.                           - Return to GI office in 4-8 weeks with Dr. Suzann                            or APP Oscar Coombs or [facility] May).                           - The findings and recommendations were discussed                            with the patient's family.                           - Patient has a contact number available for                            emergencies. The signs and symptoms of potential                            delayed complications were discussed with the                            patient. Return to normal activities tomorrow.  Written discharge instructions were provided to the                            patient. Inocente Hausen, MD 07/06/2024 9:40:22 AM This report has been  signed electronically.

## 2024-07-06 NOTE — Patient Instructions (Addendum)
 Repeat colonoscopy for surveillance based on                            pathology results.                           - Return to GI office in 4-8 weeks with Dr. Suzann                            or APP Oscar Coombs or [facility] May).                           - The findings and recommendations were discussed                            with the patient's family.                           - Patient has a contact number available for                            emergencies. The signs and symptoms of potential                            delayed complications were discussed with the                            patient. Return to normal activities tomorrow.                            Written discharge instructions were provided to the                            patient.     YOU HAD AN ENDOSCOPIC PROCEDURE TODAY AT THE [facility]:   Refer to the procedure report that was given to you for any specific questions about what was found during the examination.  If the procedure report does not answer your questions, please call your gastroenterologist to clarify.  If you requested that your care partner not be given the details of your procedure findings, then the procedure report has been included in a sealed envelope for you to review at your convenience later.  YOU SHOULD EXPECT: Some feelings of bloating in the abdomen. Passage of more gas than usual.  Walking can help get rid of the air that was put into your GI tract during the procedure and reduce the bloating. If you had a lower endoscopy (such as a colonoscopy or flexible sigmoidoscopy) you may notice spotting of blood in your stool or on the toilet paper. If you underwent a bowel prep for your procedure, you may not have a normal bowel movement for a few days.  Please Note:  You might notice some irritation and congestion in your nose or some drainage.  This is from the oxygen used during your procedure.  There is no need for concern  and it should clear up in a day or so.  SYMPTOMS TO REPORT IMMEDIATELY:  Following lower endoscopy (colonoscopy or  flexible sigmoidoscopy):  Excessive amounts of blood in the stool  Significant tenderness or worsening of abdominal pains  Swelling of the abdomen that is new, acute  Fever of 100F or higher   For urgent or emergent issues, a gastroenterologist can be reached at any hour by calling [phone]. Do not use MyChart messaging for urgent concerns.    DIET:  We do recommend a small meal at first, but then you may proceed to your regular diet.  Drink plenty of fluids but you should avoid alcoholic beverages for 24 hours.  ACTIVITY:  You should plan to take it easy for the rest of today and you should NOT DRIVE or use heavy machinery until tomorrow (because of the sedation medicines used during the test).    FOLLOW UP: Our staff will call the number listed on your records the next business day following your procedure.  We will call around 7:15- 8:00 am to check on you and address any questions or concerns that you may have regarding the information given to you following your procedure. If we do not reach you, we will leave a message.     If any biopsies were taken you will be contacted by phone or by letter within the next 1-3 weeks.  Please call us  at [phone] if you have not heard about the biopsies in 3 weeks.    SIGNATURES/CONFIDENTIALITY: You and/or your care partner have signed paperwork which will be entered into your electronic medical record.  These signatures attest to the fact that that the information above on your After Visit Summary has been reviewed and is understood.  Full responsibility of the confidentiality of this discharge information lies with you and/or your care-partner.

## 2024-07-06 NOTE — Progress Notes (Signed)
 Called to room to assist during endoscopic procedure.  Patient ID and intended procedure confirmed with present staff. Received instructions for my participation in the procedure from the performing physician.

## 2024-07-06 NOTE — Progress Notes (Signed)
 Vss nad trans to pacu

## 2024-07-07 ENCOUNTER — Telehealth: Payer: Self-pay

## 2024-07-07 NOTE — Telephone Encounter (Signed)
" °  Follow up Call-     07/06/2024    8:02 AM  Call back number  Post procedure Call Back phone  # [phone]  Permission to leave phone message Yes     Patient questions:  Do you have a fever, pain , or abdominal swelling? No. Pain Score  0 *  Have you tolerated food without any problems? Yes.    Have you been able to return to your normal activities? Yes.    Do you have any questions about your discharge instructions: Diet   No. Medications  No. Follow up visit  No.  Do you have questions or concerns about your Care? No.  Actions: * If pain score is 4 or above: No action needed, pain <4.   "

## 2024-07-08 LAB — SURGICAL PATHOLOGY

## 2024-07-10 ENCOUNTER — Ambulatory Visit: Payer: Self-pay | Admitting: Pediatrics

## 2024-07-10 ENCOUNTER — Other Ambulatory Visit: Payer: Self-pay

## 2024-07-10 DIAGNOSIS — K529 Noninfective gastroenteritis and colitis, unspecified: Secondary | ICD-10-CM

## 2024-07-14 NOTE — Telephone Encounter (Signed)
 Patient returning phone call. Please advise, thank you

## 2024-07-28 ENCOUNTER — Ambulatory Visit: Admitting: Podiatry

## 2024-08-27 ENCOUNTER — Ambulatory Visit: Admitting: Pediatrics
